# Patient Record
Sex: Male | Born: 1955 | Race: Black or African American | Hispanic: No | Marital: Single | Smoking: Former smoker
Health system: Southern US, Community
[De-identification: ages and names within clinical notes are randomized; demographics above are authoritative.]

## PROBLEM LIST (undated history)

## (undated) DIAGNOSIS — I10 Essential (primary) hypertension: Secondary | ICD-10-CM

## (undated) DIAGNOSIS — I219 Acute myocardial infarction, unspecified: Secondary | ICD-10-CM

## (undated) DIAGNOSIS — I2510 Atherosclerotic heart disease of native coronary artery without angina pectoris: Secondary | ICD-10-CM

## (undated) DIAGNOSIS — E785 Hyperlipidemia, unspecified: Secondary | ICD-10-CM

## (undated) DIAGNOSIS — I509 Heart failure, unspecified: Secondary | ICD-10-CM

## (undated) DIAGNOSIS — R55 Syncope and collapse: Secondary | ICD-10-CM

## (undated) HISTORY — DX: Hyperlipidemia, unspecified: E78.5

## (undated) HISTORY — DX: Syncope and collapse: R55

## (undated) HISTORY — PX: HERNIA REPAIR: SHX51

## (undated) HISTORY — DX: Essential (primary) hypertension: I10

## (undated) HISTORY — DX: Heart failure, unspecified: I50.9

## (undated) HISTORY — DX: Acute myocardial infarction, unspecified: I21.9

## (undated) HISTORY — DX: Atherosclerotic heart disease of native coronary artery without angina pectoris: I25.10

---

## 2004-09-18 ENCOUNTER — Ambulatory Visit: Payer: Self-pay | Admitting: Surgery

## 2013-01-10 ENCOUNTER — Emergency Department (HOSPITAL_COMMUNITY)
Admission: EM | Admit: 2013-01-10 | Discharge: 2013-01-10 | Disposition: A | Discharge status: Home / Self Care | Payer: No Typology Code available for payment source | Attending: Emergency Medicine | Admitting: Emergency Medicine

## 2013-01-10 ENCOUNTER — Encounter (HOSPITAL_COMMUNITY): Payer: Self-pay | Admitting: Emergency Medicine

## 2013-01-10 ENCOUNTER — Emergency Department (HOSPITAL_COMMUNITY): Payer: No Typology Code available for payment source

## 2013-01-10 DIAGNOSIS — F17200 Nicotine dependence, unspecified, uncomplicated: Secondary | ICD-10-CM | POA: Insufficient documentation

## 2013-01-10 DIAGNOSIS — Y92410 Unspecified street and highway as the place of occurrence of the external cause: Secondary | ICD-10-CM | POA: Insufficient documentation

## 2013-01-10 DIAGNOSIS — Y9389 Activity, other specified: Secondary | ICD-10-CM | POA: Insufficient documentation

## 2013-01-10 DIAGNOSIS — S39012A Strain of muscle, fascia and tendon of lower back, initial encounter: Secondary | ICD-10-CM

## 2013-01-10 DIAGNOSIS — S335XXA Sprain of ligaments of lumbar spine, initial encounter: Secondary | ICD-10-CM | POA: Insufficient documentation

## 2013-01-10 MED ORDER — CYCLOBENZAPRINE HCL 5 MG PO TABS: 5.0000 mg | ORAL_TABLET | Freq: Three times a day (TID) | ORAL | Status: DC | PRN

## 2013-01-10 MED ORDER — HYDROCODONE-ACETAMINOPHEN 5-325 MG PO TABS: 1.0000 | ORAL_TABLET | ORAL | Status: DC | PRN
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
## 2013-01-10 NOTE — ED Provider Notes (Signed)
Medical screening examination/treatment/procedure(s) were performed by non-physician practitioner and as supervising physician I was immediately available for consultation/collaboration.  EKG Interpretation   None      Medical screening examination/treatment/procedure(s) were performed by non-physician practitioner and as supervising physician I was immediately available for consultation/collaboration.  EKG Interpretation   None          Benny Lennert, MD 01/10/13 1444

## 2013-01-10 NOTE — ED Notes (Addendum)
4/5 muscle strength L hip flexor against gravity and resistance. Pain elicited in raising L leg to gravity.  Plantar flexion 5/5 bilaterally. Denies loss of sensation or dragging L leg w/ambulation. No step off noted on spine palpation.

## 2013-01-10 NOTE — ED Notes (Signed)
Patient with no complaints at this time. Respirations even and unlabored. Skin warm/dry. Discharge instructions reviewed with patient at this time. Patient given opportunity to voice concerns/ask questions. Patient discharged at this time and left Emergency Department with steady gait.   

## 2013-05-17 HISTORY — PX: CARDIAC CATHETERIZATION: SHX172

## 2013-05-31 ENCOUNTER — Inpatient Hospital Stay: Payer: Self-pay | Admitting: Specialist

## 2013-05-31 DIAGNOSIS — I059 Rheumatic mitral valve disease, unspecified: Secondary | ICD-10-CM

## 2013-05-31 LAB — CBC
HCT: 44.6 % (ref 40.0–52.0)
HGB: 14.5 g/dL (ref 13.0–18.0)
MCH: 28.2 pg (ref 26.0–34.0)
MCHC: 32.5 g/dL (ref 32.0–36.0)
MCV: 87 fL (ref 80–100)
Platelet: 131 10*3/uL — ABNORMAL LOW (ref 150–440)
RBC: 5.15 10*6/uL (ref 4.40–5.90)
RDW: 14.2 % (ref 11.5–14.5)
WBC: 6.9 10*3/uL (ref 3.8–10.6)

## 2013-05-31 LAB — TROPONIN I
TROPONIN-I: 0.27 ng/mL — AB
TROPONIN-I: 12 ng/mL — AB
Troponin-I: 3.66 ng/mL — ABNORMAL HIGH

## 2013-05-31 LAB — COMPREHENSIVE METABOLIC PANEL
Albumin: 3.5 g/dL (ref 3.4–5.0)
Alkaline Phosphatase: 74 U/L
Anion Gap: 5 — ABNORMAL LOW (ref 7–16)
BUN: 10 mg/dL (ref 7–18)
Bilirubin,Total: 0.4 mg/dL (ref 0.2–1.0)
Calcium, Total: 8.6 mg/dL (ref 8.5–10.1)
Chloride: 104 mmol/L (ref 98–107)
Co2: 31 mmol/L (ref 21–32)
Creatinine: 1.4 mg/dL — ABNORMAL HIGH (ref 0.60–1.30)
EGFR (African American): >60
EGFR (Non-African Amer.): 55 — ABNORMAL LOW
Glucose: 129 mg/dL — ABNORMAL HIGH (ref 65–99)
Osmolality: 280 (ref 275–301)
POTASSIUM: 3.9 mmol/L (ref 3.5–5.1)
SGOT(AST): 19 U/L (ref 15–37)
SGPT (ALT): 15 U/L (ref 12–78)
Sodium: 140 mmol/L (ref 136–145)
TOTAL PROTEIN: 7.3 g/dL (ref 6.4–8.2)

## 2013-05-31 LAB — APTT
Activated PTT: 27.1 secs (ref 23.6–35.9)
Activated PTT: 49.3 secs — ABNORMAL HIGH (ref 23.6–35.9)

## 2013-05-31 LAB — CK TOTAL AND CKMB (NOT AT ARMC)
CK, TOTAL: 253 U/L
CK, TOTAL: 374 U/L — AB
CK, Total: 532 U/L — ABNORMAL HIGH
CK-MB: 5.2 ng/mL — ABNORMAL HIGH (ref 0.5–3.6)
CK-MB: 29.8 ng/mL — ABNORMAL HIGH (ref 0.5–3.6)
CK-MB: 48.4 ng/mL — ABNORMAL HIGH (ref 0.5–3.6)

## 2013-06-01 ENCOUNTER — Encounter: Payer: Self-pay | Admitting: Cardiovascular Disease

## 2013-06-01 DIAGNOSIS — I2510 Atherosclerotic heart disease of native coronary artery without angina pectoris: Secondary | ICD-10-CM

## 2013-06-01 DIAGNOSIS — I214 Non-ST elevation (NSTEMI) myocardial infarction: Secondary | ICD-10-CM

## 2013-06-01 DIAGNOSIS — I369 Nonrheumatic tricuspid valve disorder, unspecified: Secondary | ICD-10-CM

## 2013-06-01 LAB — HEMOGLOBIN A1C: Hemoglobin A1C: 5.3 % (ref 4.2–6.3)

## 2013-06-01 LAB — LIPID PANEL
Cholesterol: 148 mg/dL (ref 0–200)
HDL Cholesterol: 37 mg/dL — ABNORMAL LOW (ref 40–60)
Ldl Cholesterol, Calc: 96 mg/dL (ref 0–100)
Triglycerides: 76 mg/dL (ref 0–200)
VLDL Cholesterol, Calc: 15 mg/dL (ref 5–40)

## 2013-06-01 LAB — TROPONIN I
Troponin-I: 24 ng/mL — ABNORMAL HIGH
Troponin-I: 32 ng/mL — ABNORMAL HIGH
Troponin-I: 34 ng/mL — ABNORMAL HIGH

## 2013-06-01 LAB — CBC WITH DIFFERENTIAL/PLATELET
Basophil #: 0.1 x10 3/mm 3
Basophil %: 1.5 %
Eosinophil #: 0.1 x10 3/mm 3
Eosinophil %: 1 %
HCT: 42.8 %
HGB: 13.7 g/dL
Lymphocyte %: 25.1 %
Lymphs Abs: 2.1 x10 3/mm 3
MCH: 27.6 pg
MCHC: 32.1 g/dL
MCV: 86 fL
Monocyte #: 0.6 "x10 3/mm "
Monocyte %: 6.6 %
Neutrophil #: 5.5 x10 3/mm 3
Neutrophil %: 65.8 %
Platelet: 122 x10 3/mm 3 — ABNORMAL LOW
RBC: 4.98 x10 6/mm 3
RDW: 13.9 %
WBC: 8.4 x10 3/mm 3

## 2013-06-01 LAB — TSH: Thyroid Stimulating Horm: 0.541 u[IU]/mL

## 2013-06-01 LAB — CK TOTAL AND CKMB (NOT AT ARMC)
CK, TOTAL: 541 U/L — AB
CK, Total: 563 U/L — ABNORMAL HIGH
CK, Total: 594 U/L — ABNORMAL HIGH
CK-MB: 37.2 ng/mL — ABNORMAL HIGH (ref 0.5–3.6)
CK-MB: 48.7 ng/mL — ABNORMAL HIGH
CK-MB: 50.3 ng/mL — AB (ref 0.5–3.6)

## 2013-06-01 LAB — BASIC METABOLIC PANEL WITH GFR
Anion Gap: 2 — ABNORMAL LOW
BUN: 9 mg/dL
Calcium, Total: 8.4 mg/dL — ABNORMAL LOW
Chloride: 106 mmol/L
Co2: 29 mmol/L
Creatinine: 1.27 mg/dL
EGFR (African American): >60
EGFR (Non-African Amer.): >60
Glucose: 96 mg/dL
Osmolality: 272
Potassium: 4 mmol/L
Sodium: 137 mmol/L

## 2013-06-01 LAB — MAGNESIUM: Magnesium: 1.7 mg/dL — ABNORMAL LOW

## 2013-06-01 LAB — APTT
ACTIVATED PTT: 50.8 s — AB (ref 23.6–35.9)
Activated PTT: 51.5 s — ABNORMAL HIGH

## 2013-06-02 ENCOUNTER — Telehealth: Payer: Self-pay

## 2013-06-02 NOTE — Telephone Encounter (Signed)
Patient contacted regarding discharge from [facility] on 06/02/13.  Patient understands to follow up with Dr. Kirke Corin on 06/05/13 at 10:45 at [facility]. Patient understands discharge instructions? yes Patient understands medications and regimen? yes Patient understands to bring all medications to this visit? yes

## 2013-06-04 ENCOUNTER — Encounter: Payer: Self-pay | Admitting: *Deleted

## 2013-06-05 ENCOUNTER — Encounter: Payer: Self-pay | Admitting: *Deleted

## 2013-06-05 ENCOUNTER — Encounter (INDEPENDENT_AMBULATORY_CARE_PROVIDER_SITE_OTHER): Payer: Self-pay

## 2013-06-05 ENCOUNTER — Encounter: Payer: Self-pay | Admitting: Cardiovascular Disease

## 2013-06-05 ENCOUNTER — Ambulatory Visit (INDEPENDENT_AMBULATORY_CARE_PROVIDER_SITE_OTHER): Payer: BC Managed Care – PPO | Admitting: Cardiovascular Disease

## 2013-06-05 VITALS — BP 100/82 | HR 63 | Ht 70.5 in | Wt 160.2 lb

## 2013-06-05 DIAGNOSIS — I2510 Atherosclerotic heart disease of native coronary artery without angina pectoris: Secondary | ICD-10-CM

## 2013-06-05 DIAGNOSIS — Z79899 Other long term (current) drug therapy: Secondary | ICD-10-CM

## 2013-06-05 DIAGNOSIS — I2589 Other forms of chronic ischemic heart disease: Secondary | ICD-10-CM

## 2013-06-05 DIAGNOSIS — E785 Hyperlipidemia, unspecified: Secondary | ICD-10-CM

## 2013-06-05 DIAGNOSIS — I255 Ischemic cardiomyopathy: Secondary | ICD-10-CM | POA: Insufficient documentation

## 2013-06-05 DIAGNOSIS — R079 Chest pain, unspecified: Secondary | ICD-10-CM

## 2013-06-05 MED ORDER — ATORVASTATIN CALCIUM 80 MG PO TABS: 40.0000 mg | ORAL_TABLET | Freq: Every day | ORAL | Status: DC

## 2013-06-05 NOTE — Progress Notes (Signed)
HPI  This is a pleasant 58 year old [place] man who is here today for followup visit after recent hospitalization for non-ST elevation myocardial infarction. [facility] with [facility] and was found to have peak troponin of 32. He underwent cardiac catheterization which showed an occluded third diagonal which was small in diameter with left to left collaterals., Moderate 50% stenosis in mid left circumflex and ejection fraction of 40%. He was treated medically with no revascularization. He has been doing very well and denies any chest pain or dyspnea. His only complaint is diarrhea. No abdominal pain or myalgia. He works in [employer] and quit smoking after his MI.   No Known Allergies   Current Outpatient Prescriptions on File Prior to Visit  Medication Sig Dispense Refill  . aspirin 81 MG tablet Take 81 mg by mouth daily.      . clopidogrel (PLAVIX) 75 MG tablet Take 75 mg by mouth daily with breakfast.      . cyclobenzaprine (FLEXERIL) 5 MG tablet Take 1 tablet (5 mg total) by mouth 3 (three) times daily as needed for muscle spasms.  15 tablet  0  . HYDROcodone-acetaminophen (NORCO/VICODIN) 5-325 MG per tablet Take 1 tablet by mouth every 4 (four) hours as needed for pain.  15 tablet  0  . lisinopril (PRINIVIL,ZESTRIL) 5 MG tablet Take 5 mg by mouth daily.      . metoprolol tartrate (LOPRESSOR) 25 MG tablet Take 25 mg by mouth every 12 (twelve) hours.       No current facility-administered medications on file prior to visit.     Past Medical History  Diagnosis Date  . MI (myocardial infarction)   . Hypertension   . Syncope and collapse   . Coronary artery disease     Non-ST elevation myocardial infarction in March of 2015. Cardiac catheterization showed an occluded third diagonal (small-diameter with left to left collaterals), 50% mid LCX and EF of 40%.   . Hyperlipidemia      Past Surgical History  Procedure Laterality Date  . Hernia repair    .  [facility]  [facility]     Family History  Problem Relation Age of Onset  . Heart attack Brother [id number]     History   Social History  . Marital Status: Single    Spouse Name: N/A    Number of Children: N/A  . Years of Education: N/A   Occupational History  . Not on file.   Social History Main Topics  . Smoking status: Former Smoker -- 1.00 packs/day for 20 years    Types: Cigarettes  . Smokeless tobacco: Not on file  . Alcohol Use: Yes     Comment: socially  . Drug Use: No     Comment: past  . Sexual Activity: Not on file   Other Topics Concern  . Not on file   Social History Narrative  . No narrative on file     ROS A 10 point review of system was performed. It is negative other than that mentioned in the history of present illness.   PHYSICAL EXAM   BP 100/82  Pulse 63  Ht 5' 10.5" (1.791 m)  Wt 160 lb 4 oz (72.689 kg)  BMI 22.66 kg/m2 Constitutional: He is oriented to person, place, and time. He appears well-developed and well-nourished. No distress.  HENT: No nasal discharge.  Head: Normocephalic and atraumatic.  Eyes: Pupils are equal and round.  No  discharge. Neck: Normal range of motion. Neck supple. No JVD present. No thyromegaly present.  Cardiovascular: Normal rate, regular rhythm, normal heart sounds. Exam reveals no gallop and no friction rub. No murmur heard.  Pulmonary/Chest: Effort normal and breath sounds normal. No stridor. No respiratory distress. He has no wheezes. He has no rales. He exhibits no tenderness.  Abdominal: Soft. Bowel sounds are normal. He exhibits no distension. There is no tenderness. There is no rebound and no guarding.  Musculoskeletal: Normal range of motion. He exhibits no edema and no tenderness.  Neurological: He is alert and oriented to person, place, and time. Coordination normal.  Skin: Skin is warm and dry. No rash noted. He is not diaphoretic. No erythema. No pallor.  Psychiatric: He has a normal  mood and affect. His behavior is normal. Judgment and thought content normal.  Right radial pulse is normal with no hematoma     ENI:DPOEUM  Rhythm  P:QRS - 1:1, Abnormal P axis, H Rate 63  -  Negative T-waves  -Possible  Anterolateral  ischemia.   ABNORMAL    ASSESSMENT AND PLAN

## 2013-06-05 NOTE — Patient Instructions (Addendum)
Your physician has recommended you make the following change in your medication:  Decrease Atorvastatin to 40 mg daily (half a tablet daily)    Your physician recommends that you return for lab work in:  Fasting labs in 1 month   Your physician recommends that you schedule a follow-up appointment in:  3 months   Resume work June 17, 2013 with no restrictions.

## 2013-06-05 NOTE — Assessment & Plan Note (Signed)
He is having diarrhea which could be due to high dose atorvastatin. I decreased the dose to 40 mg once daily. Check fasting lipid and liver profile in 4 weeks.

## 2013-06-05 NOTE — Assessment & Plan Note (Signed)
He has no symptoms of congestive heart failure. Ejection fraction was 40%. Continue treatment with small dose metoprolol and lisinopril.   

## 2013-06-05 NOTE — Assessment & Plan Note (Signed)
He is doing very well with no symptoms suggestive of recurrent angina. Continue medical therapy. He can resume work on April 1 with no restrictions. He is not able to attend cardiac rehabilitation.

## 2013-06-10 ENCOUNTER — Telehealth: Payer: Self-pay

## 2013-06-10 NOTE — Telephone Encounter (Signed)
Pt would like a nurse to call to discuss his medications.

## 2013-06-10 NOTE — Telephone Encounter (Signed)
Reviewed medication change from previous visit. Decrease Atorvastatin to 40 mg daily. Patient verbalized understanding.

## 2013-06-12 ENCOUNTER — Other Ambulatory Visit: Payer: BC Managed Care – PPO

## 2013-07-06 ENCOUNTER — Ambulatory Visit (INDEPENDENT_AMBULATORY_CARE_PROVIDER_SITE_OTHER): Payer: BC Managed Care – PPO | Admitting: *Deleted

## 2013-07-06 DIAGNOSIS — Z79899 Other long term (current) drug therapy: Secondary | ICD-10-CM

## 2013-07-07 LAB — HEPATIC FUNCTION PANEL
ALT: 22 IU/L (ref 0–44)
AST: 25 IU/L (ref 0–40)
Albumin: 4.2 g/dL (ref 3.5–5.5)
Alkaline Phosphatase: 78 IU/L (ref 39–117)
BILIRUBIN DIRECT: 0.15 mg/dL (ref 0.00–0.40)
Total Bilirubin: 0.7 mg/dL (ref 0.0–1.2)
Total Protein: 7 g/dL (ref 6.0–8.5)

## 2013-07-07 LAB — LIPID PANEL
Chol/HDL Ratio: 3.8 ratio units (ref 0.0–5.0)
Cholesterol, Total: 143 mg/dL (ref 100–199)
HDL: 38 mg/dL — ABNORMAL LOW (ref >39)
LDL Calculated: 85 mg/dL (ref 0–99)
TRIGLYCERIDES: 99 mg/dL (ref 0–149)
VLDL Cholesterol Cal: 20 mg/dL (ref 5–40)

## 2013-07-31 ENCOUNTER — Telehealth: Payer: Self-pay

## 2013-07-31 ENCOUNTER — Other Ambulatory Visit: Payer: Self-pay | Admitting: *Deleted

## 2013-07-31 MED ORDER — LISINOPRIL 5 MG PO TABS: 5.0000 mg | ORAL_TABLET | Freq: Every day | ORAL | Status: DC

## 2013-07-31 MED ORDER — CLOPIDOGREL BISULFATE 75 MG PO TABS: 75.0000 mg | ORAL_TABLET | Freq: Every day | ORAL | Status: DC

## 2013-07-31 MED ORDER — ATORVASTATIN CALCIUM 80 MG PO TABS: 40.0000 mg | ORAL_TABLET | Freq: Every day | ORAL | Status: DC

## 2013-07-31 MED ORDER — METOPROLOL TARTRATE 25 MG PO TABS: 25.0000 mg | ORAL_TABLET | Freq: Two times a day (BID) | ORAL | Status: DC

## 2013-07-31 NOTE — Telephone Encounter (Signed)
Pt called and has a question regarding his medications.

## 2013-07-31 NOTE — Telephone Encounter (Signed)
Pt called needing refill on cardiac meds  Refill will be sent

## 2013-09-10 ENCOUNTER — Ambulatory Visit (INDEPENDENT_AMBULATORY_CARE_PROVIDER_SITE_OTHER): Payer: BC Managed Care – PPO | Admitting: Cardiovascular Disease

## 2013-09-10 ENCOUNTER — Encounter: Payer: Self-pay | Admitting: Cardiovascular Disease

## 2013-09-10 VITALS — BP 114/90 | HR 60 | Ht 70.0 in | Wt 170.0 lb

## 2013-09-10 DIAGNOSIS — I2589 Other forms of chronic ischemic heart disease: Secondary | ICD-10-CM

## 2013-09-10 DIAGNOSIS — E785 Hyperlipidemia, unspecified: Secondary | ICD-10-CM

## 2013-09-10 DIAGNOSIS — I255 Ischemic cardiomyopathy: Secondary | ICD-10-CM

## 2013-09-10 DIAGNOSIS — I2510 Atherosclerotic heart disease of native coronary artery without angina pectoris: Secondary | ICD-10-CM

## 2013-09-10 DIAGNOSIS — I25119 Atherosclerotic heart disease of native coronary artery with unspecified angina pectoris: Secondary | ICD-10-CM

## 2013-09-10 DIAGNOSIS — I209 Angina pectoris, unspecified: Secondary | ICD-10-CM

## 2013-09-10 NOTE — Assessment & Plan Note (Signed)
He is doing very well with no symptoms suggestive of angina. Continue medical therapy. Plavix can be held 5 days before dental procedures if needed given that he did not have any angioplasty or stent placement.  The plan is to treat with dual antiplatelet therapy for one year.

## 2013-09-10 NOTE — Assessment & Plan Note (Signed)
He has no symptoms of congestive heart failure. Ejection fraction was 40%. Continue treatment with small dose metoprolol and lisinopril.

## 2013-09-10 NOTE — Progress Notes (Signed)
HPI  This is a pleasant 58 year old [place] man who is here today for followup visit regarding coronary artery disease.  [facility] with non-ST elevation myocardial infarction and peak troponin of 32. He underwent cardiac catheterization which showed an occluded third diagonal which was small in diameter with left to left collaterals., Moderate 50% stenosis in mid left circumflex and ejection fraction of 40%. He was treated medically with no revascularization. He has been doing very well and denies any chest pain or dyspnea. His only complaint is diarrhea. No abdominal pain or myalgia. He works in [employer] and quit smoking after his MI. He resumed work without limitations.   No Known Allergies   Current Outpatient Prescriptions on File Prior to Visit  Medication Sig Dispense Refill  . aspirin 81 MG tablet Take 81 mg by mouth daily.      Marland Kitchen. atorvastatin (LIPITOR) 80 MG tablet Take 0.5 tablets (40 mg total) by mouth daily.  15 tablet  6  . clopidogrel (PLAVIX) 75 MG tablet Take 1 tablet (75 mg total) by mouth daily with breakfast.  30 tablet  6  . lisinopril (PRINIVIL,ZESTRIL) 5 MG tablet Take 1 tablet (5 mg total) by mouth daily.  30 tablet  6  . metoprolol tartrate (LOPRESSOR) 25 MG tablet Take 1 tablet (25 mg total) by mouth every 12 (twelve) hours.  60 tablet  6   No current facility-administered medications on file prior to visit.     Past Medical History  Diagnosis Date  . MI (myocardial infarction)   . Hypertension   . Syncope and collapse   . Coronary artery disease     Non-ST elevation myocardial infarction in March of 2015. Cardiac catheterization showed an occluded third diagonal (small-diameter with left to left collaterals), 50% mid LCX and EF of 40%.   . Hyperlipidemia      Past Surgical History  Procedure Laterality Date  . Hernia repair    . Card[facility]  [facility]     Family History  Problem Relation  Age of Onset  . Heart attack Brother [id number]     History   Social History  . Marital Status: Single    Spouse Name: N/A    Number of Children: N/A  . Years of Education: N/A   Occupational History  . Not on file.   Social History Main Topics  . Smoking status: Former Smoker -- 1.00 packs/day for 20 years    Types: Cigarettes  . Smokeless tobacco: Not on file  . Alcohol Use: Yes     Comment: socially  . Drug Use: No     Comment: past  . Sexual Activity: Not on file   Other Topics Concern  . Not on file   Social History Narrative  . No narrative on file     ROS A 10 point review of system was performed. It is negative other than that mentioned in the history of present illness.   PHYSICAL EXAM   BP 114/90  Pulse 60  Ht 5\' 10"  (1.778 m)  Wt 170 lb (77.111 kg)  BMI 24.39 kg/m2 Constitutional: He is oriented to person, place, and time. He appears well-developed and well-nourished. No distress.  HENT: No nasal discharge.  Head: Normocephalic and atraumatic.  Eyes: Pupils are equal and round.  No discharge. Neck: Normal range of motion. Neck supple. No JVD present. No thyromegaly present.  Cardiovascular: Normal rate, regular rhythm,  normal heart sounds. Exam reveals no gallop and no friction rub. No murmur heard.  Pulmonary/Chest: Effort normal and breath sounds normal. No stridor. No respiratory distress. He has no wheezes. He has no rales. He exhibits no tenderness.  Abdominal: Soft. Bowel sounds are normal. He exhibits no distension. There is no tenderness. There is no rebound and no guarding.  Musculoskeletal: Normal range of motion. He exhibits no edema and no tenderness.  Neurological: He is alert and oriented to person, place, and time. Coordination normal.  Skin: Skin is warm and dry. No rash noted. He is not diaphoretic. No erythema. No pallor.  Psychiatric: He has a normal mood and affect. His behavior is normal. Judgment and thought content normal.        ASSESSMENT AND PLAN

## 2013-09-10 NOTE — Assessment & Plan Note (Signed)
Lab Results  Component Value Date   HDL 38* 07/06/2013   LDLCALC 85 07/06/2013   TRIG 99 07/06/2013   CHOLHDL 3.8 07/06/2013   Continue treatment with atorvastatin.

## 2013-09-10 NOTE — Patient Instructions (Signed)
Continue same medications.   You can stop Plavix 5 days before dental work and then resume after.   Your physician wants you to follow-up in: 6 months.  You will receive a reminder letter in the mail two months in advance. If you don't receive a letter, please call our office to schedule the follow-up appointment.

## 2013-10-05 ENCOUNTER — Telehealth: Payer: Self-pay | Admitting: *Deleted

## 2013-10-05 NOTE — Telephone Encounter (Signed)
The pharmacy he used is very expensive. He can get generic plavix for very cheap from [facility] in [place]. He should call them and ask.

## 2013-10-05 NOTE — Telephone Encounter (Signed)
Patient called and medicine is too expensive (Clopidogrel). Paid $89.78 for 30 day supply.

## 2013-10-06 NOTE — Telephone Encounter (Signed)
Informed patient of Dr. Sheilah Pigeon response  Patient stated he will call pharmacy and then call me to let me know if he would like me to send rx to [facility]

## 2014-03-15 ENCOUNTER — Ambulatory Visit: Payer: BC Managed Care – PPO | Admitting: Cardiovascular Disease

## 2014-03-15 ENCOUNTER — Encounter: Payer: Self-pay | Admitting: *Deleted

## 2014-05-19 ENCOUNTER — Other Ambulatory Visit: Payer: Self-pay | Admitting: *Deleted

## 2014-05-19 ENCOUNTER — Telehealth: Payer: Self-pay | Admitting: Cardiovascular Disease

## 2014-05-19 MED ORDER — METOPROLOL TARTRATE 25 MG PO TABS: 25.0000 mg | ORAL_TABLET | Freq: Two times a day (BID) | ORAL | Status: DC

## 2014-05-19 MED ORDER — CLOPIDOGREL BISULFATE 75 MG PO TABS: 75.0000 mg | ORAL_TABLET | Freq: Every day | ORAL | Status: DC

## 2014-05-19 MED ORDER — LISINOPRIL 5 MG PO TABS: 5.0000 mg | ORAL_TABLET | Freq: Every day | ORAL | Status: DC

## 2014-05-19 MED ORDER — ASPIRIN 81 MG PO TABS: 81.0000 mg | ORAL_TABLET | Freq: Every day | ORAL | Status: AC

## 2014-05-19 MED ORDER — ATORVASTATIN CALCIUM 80 MG PO TABS: 40.0000 mg | ORAL_TABLET | Freq: Every day | ORAL | Status: DC

## 2014-05-19 NOTE — Telephone Encounter (Signed)
Rx sent for all cardiac medications until pt is able to come in for appointment.

## 2014-05-19 NOTE — Telephone Encounter (Signed)
°  1. Which medications need to be refilled? All of them (see list)   Patient only has enough for the next 2 days   Aspirin (Tab) aspirin 81 MG     Atorvastatin Calcium (Tab) LIPITOR 80 MG   Clopidogrel Bisulfate (Tab) PLAVIX 75 MG     Lisinopril (Tab) PRINIVIL,ZESTRIL 5 MG     Metoprolol Tartrate (Tab) LOPRESSOR 25 MG       2. Which pharmacy is medication to be sent to?[facility] [place]   3. Do they need a 30 day or 90 day supply? Did not have preference  4. Would they like a call back once the medication has been sent to the pharmacy? Yes please, Patient is Overdue for 6 month but would like to check to see if [facility] will refill long enough for him to make it to his upcoming appt.

## 2014-05-31 ENCOUNTER — Ambulatory Visit: Payer: Self-pay | Admitting: Cardiovascular Disease

## 2014-06-17 ENCOUNTER — Encounter (INDEPENDENT_AMBULATORY_CARE_PROVIDER_SITE_OTHER): Payer: Self-pay

## 2014-06-17 ENCOUNTER — Encounter: Payer: Self-pay | Admitting: Cardiovascular Disease

## 2014-06-17 ENCOUNTER — Ambulatory Visit (INDEPENDENT_AMBULATORY_CARE_PROVIDER_SITE_OTHER): Payer: BLUE CROSS/BLUE SHIELD | Admitting: Cardiovascular Disease

## 2014-06-17 VITALS — BP 128/100 | HR 65 | Ht 71.0 in | Wt 178.0 lb

## 2014-06-17 DIAGNOSIS — I255 Ischemic cardiomyopathy: Secondary | ICD-10-CM

## 2014-06-17 DIAGNOSIS — E785 Hyperlipidemia, unspecified: Secondary | ICD-10-CM

## 2014-06-17 DIAGNOSIS — I25119 Atherosclerotic heart disease of native coronary artery with unspecified angina pectoris: Secondary | ICD-10-CM | POA: Diagnosis not present

## 2014-06-17 MED ORDER — METOPROLOL TARTRATE 25 MG PO TABS: 25.0000 mg | ORAL_TABLET | Freq: Two times a day (BID) | ORAL | Status: DC

## 2014-06-17 MED ORDER — ATORVASTATIN CALCIUM 80 MG PO TABS: 40.0000 mg | ORAL_TABLET | Freq: Every day | ORAL | Status: DC

## 2014-06-17 MED ORDER — LISINOPRIL 5 MG PO TABS: 5.0000 mg | ORAL_TABLET | Freq: Every day | ORAL | Status: DC

## 2014-06-17 NOTE — Assessment & Plan Note (Signed)
Lab Results  Component Value Date   CHOL 143 07/06/2013   HDL 38* 07/06/2013   LDLCALC 85 07/06/2013   TRIG 99 07/06/2013   CHOLHDL 3.8 07/06/2013   Continue treatment with atorvastatin.

## 2014-06-17 NOTE — Assessment & Plan Note (Signed)
He has no symptoms of congestive heart failure. Ejection fraction was 40%. I resumed treatment with small dose metoprolol and lisinopril.

## 2014-06-17 NOTE — Progress Notes (Signed)
HPI  This is a pleasant 59 year old [place] man who is here today for followup visit regarding coronary artery disease.  He was hospitalized at [facility] in March of 2015 with non-ST elevation myocardial infarction and peak troponin of 32. He underwent cardiac catheterization which showed an occluded third diagonal which was small in diameter with left to left collaterals., Moderate 50% stenosis in mid left circumflex and ejection fraction of 40%. He was treated medically with no revascularization. He has been doing very well and denies any chest pain or dyspnea. 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 He stopped taking his medications in December due to financial reasons.   No Known Allergies   Current Outpatient Prescriptions on File Prior to Visit  Medication Sig Dispense Refill  . aspirin 81 MG tablet Take 1 tablet (81 mg total) by mouth daily. 30 tablet 1  . atorvastatin (LIPITOR) 80 MG tablet Take 0.5 tablets (40 mg total) by mouth daily. (Patient not taking: Reported on 06/17/2014) 15 tablet 1  . clopidogrel (PLAVIX) 75 MG tablet Take 1 tablet (75 mg total) by mouth daily with breakfast. (Patient not taking: Reported on 06/17/2014) 30 tablet 1  . lisinopril (PRINIVIL,ZESTRIL) 5 MG tablet Take 1 tablet (5 mg total) by mouth daily. (Patient not taking: Reported on 06/17/2014) 30 tablet 1  . metoprolol tartrate (LOPRESSOR) 25 MG tablet Take 1 tablet (25 mg total) by mouth every 12 (twelve) hours. (Patient not taking: Reported on 06/17/2014) 60 tablet 1   No current facility-administered medications on file prior to visit.     Past Medical History  Diagnosis Date  . MI (myocardial infarction)   . Hypertension   . Syncope and collapse   . Coronary artery disease     Non-ST elevation myocardial infarction in March of 2015. Cardiac catheterization showed an occluded third diagonal (small-diameter with left to left collaterals), 50% mid LCX and EF of 40%.   . Hyperlipidemia       Past Surgical History  Procedure Laterality Date  . Hernia repair    . Cardiac catheterization  05/2013    [facility]     Family History  Problem Relation Age of Onset  . Heart attack Brother 82     History   Social History  . Marital Status: Single    Spouse Name: N/A  . Number of Children: N/A  . Years of Education: N/A   Occupational History  . Not on file.   Social History Main Topics  . Smoking status: Former Smoker -- 1.00 packs/day for 20 years    Types: Cigarettes  . Smokeless tobacco: Not on file  . Alcohol Use: Yes     Comment: socially  . Drug Use: No     Comment: past  . Sexual Activity: Not on file   Other Topics Concern  . Not on file   Social History Narrative     ROS A 10 point review of system was performed. It is negative other than that mentioned in the history of present illness.   PHYSICAL EXAM   BP 128/100 mmHg  Pulse 65  Ht 5\' 11"  (1.803 m)  Wt 178 lb (80.74 kg)  BMI 24.84 kg/m2 Constitutional: He is oriented to person, place, and time. He appears well-developed and well-nourished. No distress.  HENT: No nasal discharge.  Head: Normocephalic and atraumatic.  Eyes: Pupils are equal and round.  No discharge. Neck: Normal range of motion. Neck supple. No JVD present. No  thyromegaly present.  Cardiovascular: Normal rate, regular rhythm, normal heart sounds. Exam reveals no gallop and no friction rub. No murmur heard.  Pulmonary/Chest: Effort normal and breath sounds normal. No stridor. No respiratory distress. He has no wheezes. He has no rales. He exhibits no tenderness.  Abdominal: Soft. Bowel sounds are normal. He exhibits no distension. There is no tenderness. There is no rebound and no guarding.  Musculoskeletal: Normal range of motion. He exhibits no edema and no tenderness.  Neurological: He is alert and oriented to person, place, and time. Coordination normal.  Skin: Skin is warm and dry. No rash noted. He is not diaphoretic.  No erythema. No pallor.  Psychiatric: He has a normal mood and affect. His behavior is normal. Judgment and thought content normal.    ZOX:[id number]  Rhythm  WITHIN NORMAL LIMITS   ASSESSMENT AND PLAN

## 2014-06-17 NOTE — Patient Instructions (Signed)
Stop taking Clopidogrel ( Plavix ).  Continue other medications.   Your physician wants you to follow-up in: 6 months.  You will receive a reminder letter in the mail two months in advance. If you don't receive a letter, please call our office to schedule the follow-up appointment.  

## 2014-06-17 NOTE — Assessment & Plan Note (Signed)
He is doing very well overall from a cardiac standpoint with no symptoms suggestive of angina. I discussed with him the importance of taking his medications regularly. It has been more than one year since his myocardial infarction. Thus, I discontinued treatment with Plavix.

## 2014-07-10 NOTE — Consult Note (Signed)
Brief Consult Note: Diagnosis: NSTEMI.   Patient was seen by consultant.   Consult note dictated.   Recommend to proceed with surgery or procedure.   Comments: NSTEMI.  Cardiac cath today.  Electronic Signatures: Lorine Bears (MD)  (Signed 16-Mar-15 07:08)  Authored: Brief Consult Note   Last Updated: 16-Mar-15 07:08 by Lorine Bears (MD)

## 2014-07-10 NOTE — Discharge Summary (Signed)
PATIENT NAME:  Manuel Hess, Manuel Hess MR#:  [id number] DATE OF BIRTH:  06-May-1955  DATE OF ADMISSION:  05/31/2013 DATE OF DISCHARGE:  06/02/2013  For a detailed note, please take a look at the history and physical done on admission by Dr. Jacques Navy.   DIAGNOSES AT DISCHARGE: As follows: 1.  Non-ST-elevation myocardial infarction.  2.  Hypertension.  3.  Coronary artery disease.   DIET: The patient is being discharged on a low-sodium, low-fat diet.   ACTIVITY: As tolerated.   FOLLOWUP: With Dr. Lorine Bears in the next 1 to 2 weeks.   DISCHARGE MEDICATIONS: Aspirin 81 mg daily, Plavix 75 mg daily, lisinopril 5 mg daily, atorvastatin 80 mg at bedtime, and metoprolol tartrate 25 mg b.i.d.   CONSULTANTS DURING THE HOSPITAL COURSE: Dr. Kirke Corin from cardiology.   PERTINENT STUDIES DONE DURING THE HOSPITAL COURSE: Chest x-ray done on admission showing mild bibasilar atelectasis. Echocardiogram done on March 15 showing EF of 35% to 40%, moderately decreased global LV systolic function, diffuse hypokinesis, diastolic relaxation abnormality noted, mild mitral valve regurgitation, mild tricuspid regurgitation.   The patient had a cardiac catheterization on March 16, which showed global ejection fraction of 40%. The patient had severe 1-vessel coronary artery disease, occluded third diagonal.   HOSPITAL COURSE: This is a 59 year old male with medical problems as mentioned above, presented to the hospital with chest pain and noted to have elevated troponins.  1.  Non-ST elevation MI: This was the likely diagnosis given the patient's chest pain and elevated troponins. The patient's troponins peaked as high as 30s. The patient was given Lovenox, started on aspirin, nitro, beta blocker, and statin. A cardiology consult was obtained. The patient underwent a cardiac catheterization done by Dr. Kirke Corin, which showed a completely occluded diagonal vessel, which was likely culprit vessel for the MI, although was completely   occluded and therefore not amenable to intervention. At this point, we are managing the patient medically with aspirin, Plavix, beta blocker, statin, and ACE inhibitor, The patient will have short followup with cardiology as an outpatient. The patient is presently chest pain-free and hemodynamically stable and therefore being discharged home. He is advised to not lift anything heavy over 10 pounds in the next 2 weeks and is to stay off of work for the next 2 weeks.  2.  Hypertension: The patient remained hemodynamically stable. He will continue his metoprolol and ACE inhibitor as stated.  3.  Hyperlipidemia: Given his recent MI, the patient was started on high-dose statin and he will resume that.   CODE STATUS: The patient is a full code.   TIME SPENT: 40 minutes.   ____________________________ Rolly Pancake. Cherlynn Kaiser, MD vjs:jcm D: 06/02/2013 16:08:12 ET T: 06/02/2013 20:47:07 ET JOB#: [id number]  cc: Rolly Pancake. Cherlynn Kaiser, MD, <Dictator> Muhammad A. Kirke Corin, MD Houston Siren MD ELECTRONICALLY SIGNED 06/12/2013 83:29

## 2014-07-10 NOTE — Consult Note (Signed)
PATIENT NAME:  Manuel Hess, Manuel Hess MR#:  [id number] DATE OF BIRTH:  May 27, 1955  DATE OF CONSULTATION:  06/01/2013  REFERRING PHYSICIAN:  Krystal Eaton, MD CONSULTING PHYSICIAN:  Bynum Mccullars A. Kirke Corin, MD  REASON FOR CONSULTATION: Non-ST elevation myocardial infarction.   HISTORY OF PRESENT ILLNESS: This is a 59 year old African American male with no previous cardiac history. He is a long-time smoker but otherwise has no past medical history. He presented to the Emergency Room with substernal chest pain that started around 8:00 in the morning. It persisted until he was picked up by EMS and given 1 sublingual nitroglycerin. His chest pain resolved. He had mild recurrent chest pain last night but he is chest pain-free now. Initial troponin was 0.27 and subsequently increased to 32. He denies any previous cardiac history or any previous similar episodes. The chest pain has been associated with shortness of breath but no other symptoms.   PAST MEDICAL HISTORY: Chronic tobacco use.   HOME MEDICATIONS:  Include aspirin 81 mg once daily.   ALLERGIES: No known drug allergies.   SOCIAL HISTORY: Remarkable for smoking 1 pack per day for many years. He denies any alcohol or recreational drug use. He works in [employer] techn[employer].   FAMILY HISTORY: His brother had massive myocardial infarction at the age of 45. There is family history of cancer as well.   REVIEW OF SYSTEMS: A 10-point review of systems was performed. It is negative other than what is mentioned in the HPI.    PHYSICAL EXAMINATION: GENERAL: The patient appears to be at his stated age, in no acute distress.  VITAL SIGNS: Temperature 98.1, pulse 67, respiratory rate 20, blood pressure is 133/83 and oxygen saturation is 95% on room air.  HEENT: Normocephalic, atraumatic.  NECK: No JVD or carotid bruits.  RESPIRATORY: Normal respiratory effort with no use of accessory muscles. Auscultation reveals normal breath sounds.  CARDIOVASCULAR: Normal PMI. Normal S1  and S2 with no gallops or murmurs.  ABDOMEN: Benign, nontender and nondistended.  EXTREMITIES: No clubbing, cyanosis or edema.  SKIN: Warm and dry with no rash.  PSYCHIATRIC: He is alert, oriented x 3 with normal mood and affect.   LABORATORY, DIAGNOSTIC AND RADIOLOGICAL DATA: Creatinine is 1.27. Troponin most recently was 32 with a CK-MB of 50. CBC is unremarkable. ECG showed sinus rhythm with inferior T wave inversion but no significant ST deviation.   IMPRESSION: 1.  Non-ST elevation myocardial infarction.  2.  Tobacco use.   RECOMMENDATIONS: I recommend continuing treatment with aspirin, unfractionated heparin, beta blocker and a statin. He is currently chest pain-free. Echocardiogram showed ejection fraction of 35% to 40%. There was mild mitral and tricuspid regurgitation. I recommend proceeding with cardiac catheterization and possible coronary intervention. Risks, benefits and alternatives were discussed with the patient. Further recommendations to follow after cardiac catheterization.   ____________________________ Chelsea Aus Kirke Corin, MD maa:cs D: 06/01/2013 07:49:40 ET T: 06/01/2013 14:06:36 ET JOB#: [id number]  cc: Biana Haggar A. Kirke Corin, MD, <Dictator> Iran Ouch MD ELECTRONICALLY SIGNED 06/11/2013 8:05

## 2014-07-10 NOTE — H&P (Signed)
PATIENT NAME:  Manuel Hess, Manuel Hess MR#:  [id number] DATE OF BIRTH:  1955-10-18  DATE OF ADMISSION:  05/31/2013  PRIMARY CARE PHYSICIAN: None.   REFERRING PHYSICIAN: Su Ley, MD  CHIEF COMPLAINT: Chest pain.   HISTORY OF PRESENT ILLNESS: The patient is a pleasant 59 year old male with no significant past medical history, smoker, who comes in for acute-onset chest pain about 8:00 without significant radiation but with shortness of breath. The pain persisted, he called EMS and here the pain improved with 1 sublingual nitroglycerin. Initial troponin was negative but the second one went to 0.27. Chest pain is significantly better with the nitro and at this point, he has been started on a heparin drip and hospitalist services were contacted for further evaluation and management.   PAST MEDICAL HISTORY:  Denies.   ALLERGIES: Denies.   OUTPATIENT MEDICATIONS: Aspirin 81 mg daily.   SOCIAL HISTORY: Smokes a pack a day. No alcohol or drug use. Works in the [employer].   FAMILY HISTORY: Cancer runs in the family. Brother with massive MI at age 88.   REVIEW OF SYSTEMS:    CONSTITUTIONAL: No fever, fatigue, weakness or weight changes.  EYES: No blurry vision or double vision.  ENT: No tinnitus, hearing loss.  RESPIRATORY: No cough, wheezing, shortness of breath.  CARDIOVASCULAR: Chest pain as above. Chest pain significantly improved currently. No orthopnea, swelling in the legs or arrhythmia. No history of MI in the past.  GASTROINTESTINAL: No nausea, vomiting, diarrhea, black or tarry stools or bloody stools.  GENITOURINARY: Denies dysuria, hematuria.  HEMATOLOGIC AND LYMPHATIC: No anemia or easy bruising.  SKIN: No rashes.  MUSCULOSKELETAL: Denies arthritis or gout.  NEUROLOGIC: No focal weakness or numbness.  PSYCHIATRIC: Denies anxiety or insomnia.   PHYSICAL EXAMINATION: VITAL SIGNS: Temperature on arrival noted to be 97.5, pulse rate 57, respiratory rate 16, blood pressure  144/99, O2 sat 97% on room air.  GENERAL: The patient is a pleasant male lying in bed, no obvious distress.  HEENT: Normocephalic, atraumatic. Pupils are equal and reactive. Anicteric sclerae. Moist mucous membranes.  NECK: Supple. No thyroid tenderness. No cervical lymphadenopathy.  CARDIOVASCULAR: S1, S2 regular. No significant murmurs, rubs or gallops.  LUNGS: Clear to auscultation. No wheezing, rhonchi or rales.  ABDOMEN: Soft, nontender, nondistended. Positive bowel sounds in all quadrants.  EXTREMITIES: No pitting edema.  NEURO:  Cranial nerves II through XII grossly intact. Strength is 5 out of 5 in all extremities. Sensation is intact to light touch.  PSYCHIATRIC: Awake, alert, oriented x 3.  SKIN: No obvious rashes or lesions.   LABORATORY, DIAGNOSTIC AND RADIOLOGICAL DATA:  Troponin initially negative, next was 0.27 LFTs within normal limits. Creatinine mildly elevated at 1.4, sodium 140, potassium 3.9, BUN 10, glucose 129. White count of 6.9, hemoglobin is 14.5, platelets 131.  We do not have any previous labs past few years. EKG normal sinus rhythm, rate of 60, no acute ST elevations or depressions. X-ray chest, 1 view, showing mild right basilar atelectasis.   ASSESSMENT AND PLAN: We have a pleasant 59 year old smoker who comes in with acute-onset chest pain with positive troponin and no significant EKG changes consistent with non-ST elevation myocardial infarction.  At this point, we would continue the heparin drip, admit him to telemetry, cycle the troponins and start the patient on aspirin, a statin, low-dose beta blocker given the heart rate, obtain a cardiology consult, order an echocardiogram. The patient will likely need a cardiac catheterization. Would check a lipid profile as well  as a hemoglobin A1c and TSH. Would follow with the enzymes. At this point, the chest pain is significantly improved. He was counseled for more than 3 minutes about his tobacco use and he is motivated to  quit. Would follow him on telemetry and follow with PTT. He does have mild renal failure but do not know if this is acute or chronic with GFR placing him in stage 3 chronic kidney disease. Furthermore, he has mild thrombocytopenia of unknown etiology. Would follow the platelet count tomorrow  as well as start the patient on gentle IV fluids. Recheck with another GFR set tomorrow. Start him on proton pump inhibitor for gastrointestinal prophylaxis. The patient is a full code.   TOTAL TIME SPENT: Is 45 minutes.    ____________________________ Krystal Eaton, MD sa:cs D: 05/31/2013 14:28:53 ET T: 05/31/2013 15:56:58 ET JOB#: [id number]  cc: Krystal Eaton, MD, <Dictator> Krystal Eaton MD ELECTRONICALLY SIGNED 06/18/2013 15:47

## 2014-10-05 IMAGING — CR DG CHEST 1V PORT
1 series · 1 of 1 positions shown · non-contrast
Comparison: None.

CLINICAL DATA: Substernal chest pain

EXAM:
PORTABLE CHEST - 1 VIEW

[ap]
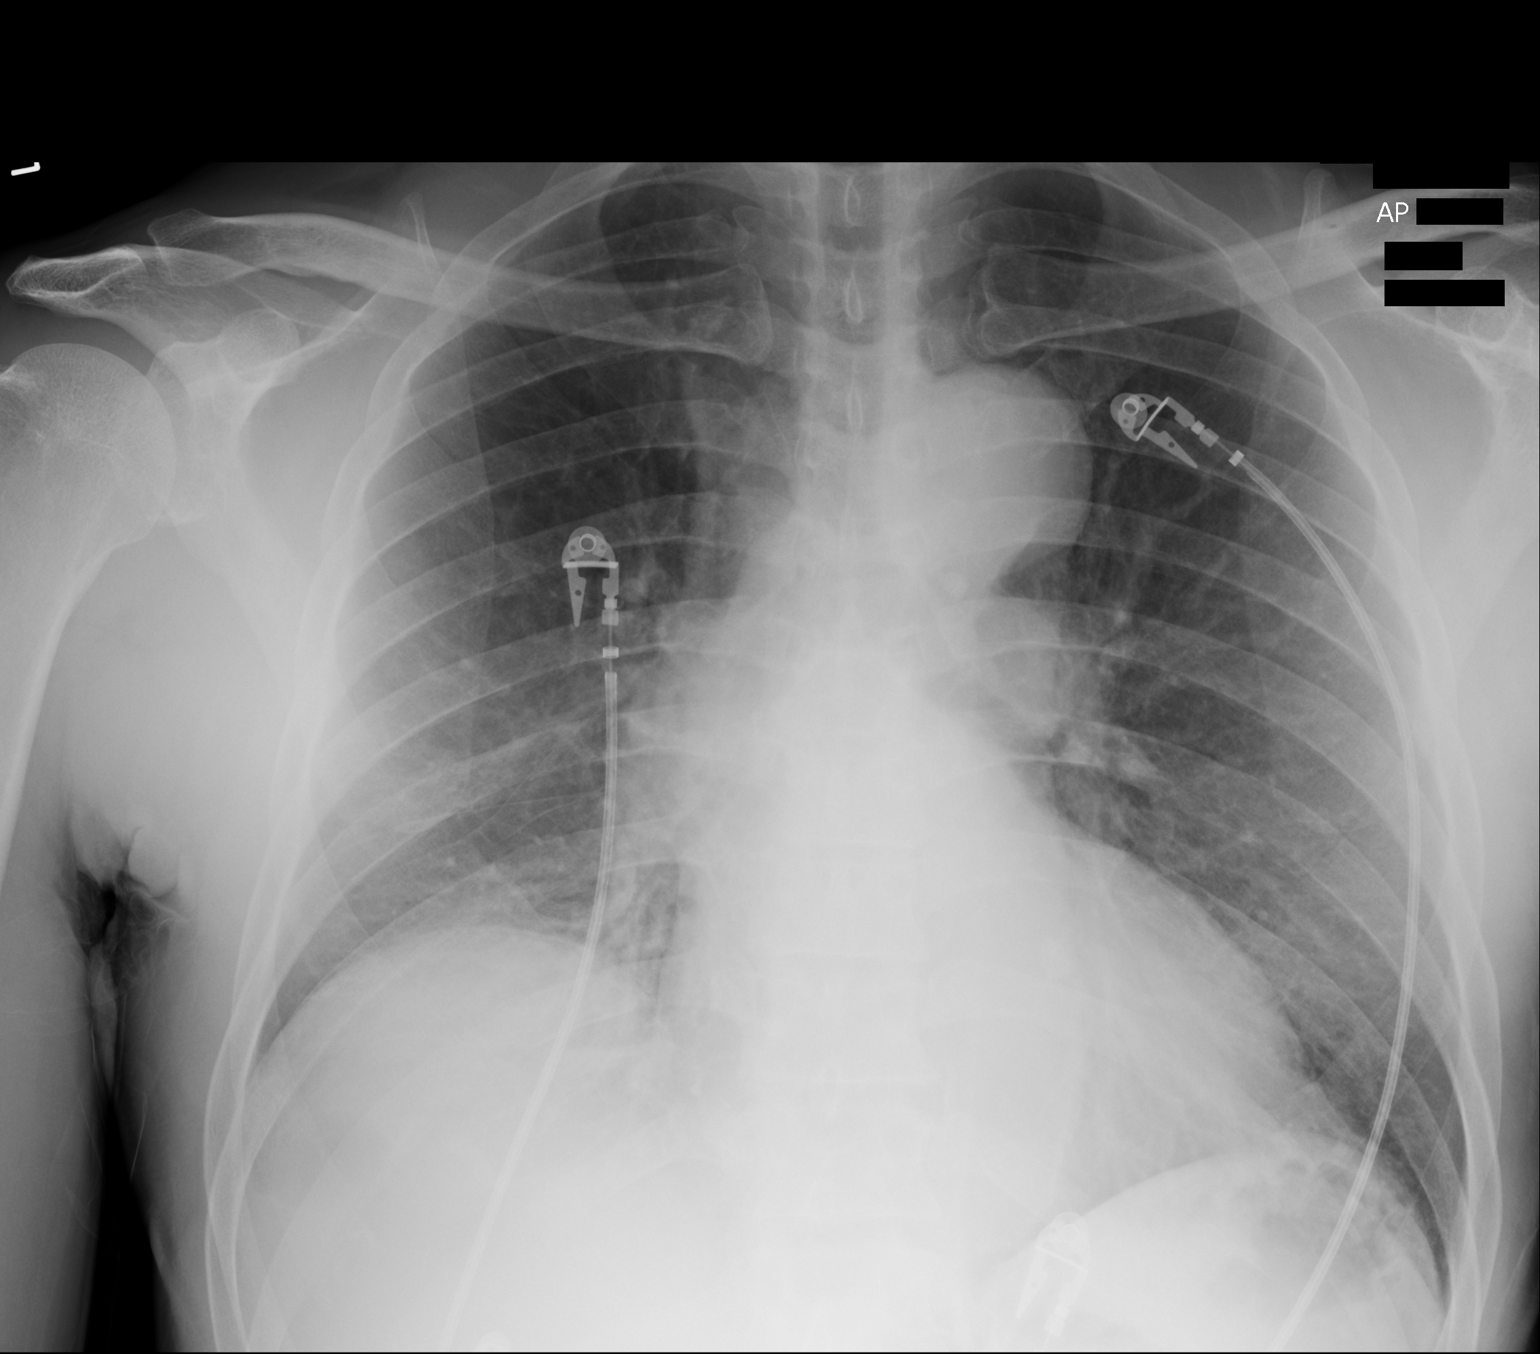

[1 of 1 positions shown; findings below may reference images not displayed]

FINDINGS: Cardiac shadow is within normal limits. There is mild right basilar
atelectasis. No focal confluent infiltrate is seen. No bony
abnormality is noted.
IMPRESSION: Mild right basilar atelectasis.

## 2015-02-07 ENCOUNTER — Telehealth: Payer: Self-pay | Admitting: Cardiovascular Disease

## 2015-02-07 NOTE — Telephone Encounter (Signed)
3 attempts to schedule from recall .  LMOV to call office for scheduling.  ° °Deleting recall.   °

## 2015-07-05 ENCOUNTER — Other Ambulatory Visit: Payer: Self-pay

## 2015-07-05 MED ORDER — LISINOPRIL 5 MG PO TABS: 5.0000 mg | ORAL_TABLET | Freq: Every day | ORAL | Status: DC

## 2015-07-05 MED ORDER — METOPROLOL TARTRATE 25 MG PO TABS: 25.0000 mg | ORAL_TABLET | Freq: Two times a day (BID) | ORAL | Status: DC

## 2015-07-05 MED ORDER — ATORVASTATIN CALCIUM 80 MG PO TABS: 40.0000 mg | ORAL_TABLET | Freq: Every day | ORAL | Status: DC

## 2017-05-14 ENCOUNTER — Emergency Department
Admission: EM | Admit: 2017-05-14 | Discharge: 2017-05-14 | Disposition: A | Discharge status: Home / Self Care | Payer: BLUE CROSS/BLUE SHIELD | Attending: Emergency Medicine | Admitting: Emergency Medicine

## 2017-05-14 ENCOUNTER — Encounter: Payer: Self-pay | Admitting: Emergency Medicine

## 2017-05-14 DIAGNOSIS — L0211 Cutaneous abscess of neck: Secondary | ICD-10-CM | POA: Diagnosis not present

## 2017-05-14 DIAGNOSIS — I2510 Atherosclerotic heart disease of native coronary artery without angina pectoris: Secondary | ICD-10-CM | POA: Diagnosis not present

## 2017-05-14 DIAGNOSIS — I252 Old myocardial infarction: Secondary | ICD-10-CM | POA: Diagnosis not present

## 2017-05-14 DIAGNOSIS — Z87891 Personal history of nicotine dependence: Secondary | ICD-10-CM | POA: Diagnosis not present

## 2017-05-14 DIAGNOSIS — Z7982 Long term (current) use of aspirin: Secondary | ICD-10-CM | POA: Diagnosis not present

## 2017-05-14 DIAGNOSIS — Z79899 Other long term (current) drug therapy: Secondary | ICD-10-CM | POA: Diagnosis not present

## 2017-05-14 DIAGNOSIS — R220 Localized swelling, mass and lump, head: Secondary | ICD-10-CM | POA: Diagnosis present

## 2017-05-14 DIAGNOSIS — I10 Essential (primary) hypertension: Secondary | ICD-10-CM | POA: Insufficient documentation

## 2017-05-14 DIAGNOSIS — L0291 Cutaneous abscess, unspecified: Secondary | ICD-10-CM

## 2017-05-14 MED ORDER — SULFAMETHOXAZOLE-TRIMETHOPRIM 800-160 MG PO TABS: 1.0000 | ORAL_TABLET | Freq: Two times a day (BID) | ORAL | 0 refills | Status: DC

## 2017-05-14 MED ORDER — HYDROCODONE-ACETAMINOPHEN 5-325 MG PO TABS: 1.0000 | ORAL_TABLET | Freq: Four times a day (QID) | ORAL | 0 refills | Status: DC | PRN

## 2017-05-14 NOTE — ED Triage Notes (Signed)
Pt with dental abscess to right side jaw.

## 2017-05-14 NOTE — Discharge Instructions (Signed)
Follow-up with [facility] or return to the emergency department if any severe worsening of your abscess.  Begin using warm moist compresses to the area frequently.  Begin taking Bactrim DS twice daily for 10 days.  You are also having a prescription for Norco if needed for moderate to severe pain especially at night while you are trying to sleep.  If area becomes soft and has not drained return to the ED for incision and drainage of this area.

## 2017-05-14 NOTE — ED Notes (Signed)
See triage note   Dental pain and gumline swelling for couple of days

## 2017-05-14 NOTE — ED Provider Notes (Signed)
[facility] Emergency Department Provider Note  ____________________________________________   First MD Initiated Contact with Patient 05/14/17 1109     (approximate)  I have reviewed the triage vital signs and the nursing notes.   HISTORY  Chief Complaint Oral Swelling   HPI Manuel Hess is a 62 y.o. male is here with complaint of right facial swelling along with a area to the back of his neck.  Patient states he has not been taking any over-the-counter medication but has been squeezing the area on the back of his neck.  Patient denies any previous history of abscesses.  He is unaware of any fever or chills.  He rates his pain as a 10/10.  Past Medical History:  Diagnosis Date  . Coronary artery disease    Non-ST elevation myocardial infarction in March of 2015. Cardiac catheterization showed an occluded third diagonal (small-diameter with left to left collaterals), 50% mid LCX and EF of 40%.   . Hyperlipidemia   . Hypertension   . MI (myocardial infarction) (HCC)   . Syncope and collapse     Patient Active Problem List   Diagnosis Date Noted  . Cardiomyopathy, ischemic 06/05/2013  . Coronary artery disease   . Hyperlipidemia     Past Surgical History:  Procedure Laterality Date  . CARDIAC CATHETERIZATION  05/2013   [facility]  . HERNIA REPAIR      Prior to Admission medications   Medication Sig Start Date End Date Taking? Authorizing Provider  aspirin 81 MG tablet Take 1 tablet (81 mg total) by mouth daily. 05/19/14   Iran Ouch, MD  atorvastatin (LIPITOR) 80 MG tablet Take 0.5 tablets (40 mg total) by mouth daily. 07/05/15   Iran Ouch, MD  HYDROcodone-acetaminophen (NORCO/VICODIN) 5-325 MG tablet Take 1 tablet by mouth every 6 (six) hours as needed for moderate pain. 05/14/17   Tommi Rumps, PA-C  lisinopril (PRINIVIL,ZESTRIL) 5 MG tablet Take 1 tablet (5 mg total) by mouth daily. 07/05/15   Iran Ouch, MD  metoprolol  tartrate (LOPRESSOR) 25 MG tablet Take 1 tablet (25 mg total) by mouth every 12 (twelve) hours. 07/05/15   Iran Ouch, MD  sulfamethoxazole-trimethoprim (BACTRIM DS,SEPTRA DS) 800-160 MG tablet Take 1 tablet by mouth 2 (two) times daily. 05/14/17   Tommi Rumps, PA-C    Allergies Patient has no known allergies.  Family History  Problem Relation Age of Onset  . Heart attack Brother 60    Social History Social History   Tobacco Use  . Smoking status: Former Smoker    Packs/day: 1.00    Years: 20.00    Pack years: 20.00    Types: Cigarettes  . Smokeless tobacco: Never Used  Substance Use Topics  . Alcohol use: Yes    Comment: socially  . Drug use: No    Comment: past    Review of Systems Constitutional: No fever/chills Cardiovascular: Denies chest pain. Respiratory: Denies shortness of breath. Gastrointestinal:   No nausea, no vomiting.   Musculoskeletal: Negative for back pain. Skin: Positive for abscess. Neurological: Negative for headaches ____________________________________________   PHYSICAL EXAM:  VITAL SIGNS: ED Triage Vitals  Enc Vitals Group     BP 05/14/17 1026 (!) 142/93     Pulse Rate 05/14/17 1026 97     Resp 05/14/17 1026 18     Temp 05/14/17 1026 98.6 F (37 C)     Temp Source 05/14/17 1026 Oral     SpO2 05/14/17  1026 99 %     Weight --      Height --      Head Circumference --      Peak Flow --      Pain Score 05/14/17 1027 10     Pain Loc --      Pain Edu? --      Excl. in GC? --    Constitutional: Alert and oriented. Well appearing and in no acute distress. Eyes: Conjunctivae are normal.  Head: Atraumatic. Nose: No congestion/rhinnorhea. Neck: No stridor.   Cardiovascular: Normal rate, regular rhythm. Grossly normal heart sounds.  Good peripheral circulation. Respiratory: Normal respiratory effort.  No retractions. Lungs CTAB. Musculoskeletal: Moves upper and lower extremities without any difficulty and normal gait was  noted. Neurologic:  Normal speech and language. No gross focal neurologic deficits are appreciated. No gait instability. Skin:  Skin is warm, dry.  Right facial area in the beard area appears that patient has had folliculitis barbae and currently has an erythematous area that is tender to palpation and extremely firm.  On examination of the left posterior scalp/neck there is also an erythematous lesion that is extremely firm to touch.  Area is tender.  There is no area of fluctuance noted. Psychiatric: Mood and affect are normal. Speech and behavior are normal.  ____________________________________________   LABS (all labs ordered are listed, but only abnormal results are displayed)  Labs Reviewed - No data to display  PROCEDURES  Procedure(s) performed: None  Procedures  Critical Care performed: No  ____________________________________________   INITIAL IMPRESSION / ASSESSMENT AND PLAN / ED COURSE Patient was encouraged to use moist compresses to both areas frequently.  He was placed on Bactrim DS twice daily for 10 days along with Norco if needed for moderate to severe pain.  Patient will return to the ED for I&D if either area becomes soft and does not drain in the next several days.  ____________________________________________   FINAL CLINICAL IMPRESSION(S) / ED DIAGNOSES  Final diagnoses:  Abscess     ED Discharge Orders        Ordered    sulfamethoxazole-trimethoprim (BACTRIM DS,SEPTRA DS) 800-160 MG tablet  2 times daily     05/14/17 1228    HYDROcodone-acetaminophen (NORCO/VICODIN) 5-325 MG tablet  Every 6 hours PRN     05/14/17 1228       Note:  This document was prepared using Dragon voice recognition software and may include unintentional dictation errors.    Tommi Rumps, PA-C 05/14/17 1303    Emily Filbert, MD 05/14/17 [phone]

## 2019-10-19 ENCOUNTER — Encounter: Payer: Self-pay | Admitting: Cardiology

## 2019-10-19 NOTE — Progress Notes (Signed)
Cardiology Office Note  Date: 10/20/2019   ID: Manuel Hess, DOB [birth year], MRN [id number]  PCP:  Elfredia Nevins, MD  Cardiologist:  Nona Dell, MD Electrophysiologist:  None   Chief Complaint  Patient presents with  . Establish cardiac follow-up    History of Present Illness: Manuel Hess is a 64 y.o. male referred for cardiology consultation by Dr. Sherwood Gambler to reestablish cardiology follow-up.  I reviewed the available records.  Chart review finds previous follow-up with Dr. Kirke Corin, last seen in 2016.  He has a history of NSTEMI in 2015, managed medically after cardiac catheterization demonstrated occlusion of a third diagonal branch which was small and associated with left to left collaterals, also moderate disease within the mid circumflex.  He has not had regular cardiology follow-up for years, not on regular medications until just recently.  He is currently on aspirin, recently started on Norvasc which he is taking at 5 mg daily.  Lab work from June showed LDL 159.  In the past he was on Lipitor at 40 mg daily, does not describe any intolerances.  He has not had a recent follow-up echocardiogram, LVEF was 35 to 40% as of 2015.  In terms of symptoms, he does not describe any active angina, no palpitations or syncope.  In general he has NYHA class II dyspnea.  He works at a [employer] as a Administrator, sports.  I personally reviewed his ECG today which shows sinus rhythm with PAC.  Past Medical History:  Diagnosis Date  . Coronary artery disease    Non-ST elevation myocardial infarction in March of 2015. Cardiac catheterization showed an occluded third diagonal (small-diameter with left to left collaterals), 50% mid LCX and EF of 40%.   . Essential hypertension   . Hyperlipidemia   . MI (myocardial infarction) [facility])     Past Surgical History:  Procedure Laterality Date  . CARDIAC CATHETERIZATION  05/2013   [facility]  . HERNIA REPAIR      Current Outpatient Medications    Medication Sig Dispense Refill  . aspirin 81 MG tablet Take 1 tablet (81 mg total) by mouth daily. 30 tablet 1  . amLODipine (NORVASC) 5 MG tablet Take 1 tablet (5 mg total) by mouth daily. 90 tablet 3  . atorvastatin (LIPITOR) 40 MG tablet Take 1 tablet (40 mg total) by mouth at bedtime. 90 tablet 3   No current facility-administered medications for this visit.   Allergies:  Patient has no known allergies.   Social History: The patient  reports that he has quit smoking. His smoking use included cigarettes. He has a 20.00 pack-year smoking history. He has never used smokeless tobacco. He reports current alcohol use. He reports that he does not use drugs.   Family History: The patient's family history includes Heart attack (age of onset: 22) in his brother.   ROS:  Please see the history of present illness. Otherwise, complete review of systems is positive for none.  All other systems are reviewed and negative.   Physical Exam: VS:  BP (!) 120/92   Pulse 80   Ht 5\' 10"  (1.778 m)   Wt 172 lb 9.6 oz (78.3 kg)   SpO2 100%   BMI 24.77 kg/m , BMI Body mass index is 24.77 kg/m.  Wt Readings from Last 3 Encounters:  10/20/19 172 lb 9.6 oz (78.3 kg)  06/17/14 178 lb (80.7 kg)  09/10/13 170 lb (77.1 kg)    General: Patient appears comfortable at rest. HEENT:  Conjunctiva and lids normal, wearing a mask. Neck: Supple, no elevated JVP or carotid bruits, no thyromegaly. Lungs: Clear to auscultation, nonlabored breathing at rest. Cardiac: Regular rate and rhythm with occasional ectopy, no S3, soft systolic murmur, no pericardial rub. Abdomen: Soft, nontender, bowel sounds present. Extremities: No pitting edema, distal pulses 2+. Skin: Warm and dry. Musculoskeletal: No kyphosis. Neuropsychiatric: Alert and oriented x3, affect grossly appropriate.  ECG:  An ECG dated 06/17/2014 was personally reviewed today and demonstrated:  Normal sinus rhythm.  Recent Labwork: No results found for  requested labs within last 8760 hours.     Component Value Date/Time   CHOL 143 07/06/2013 0848   CHOL 148 06/01/2013 0319   TRIG 99 07/06/2013 0848   TRIG 76 06/01/2013 0319   HDL 38 (L) 07/06/2013 0848   HDL 37 (L) 06/01/2013 0319   CHOLHDL 3.8 07/06/2013 0848   VLDL 15 06/01/2013 0319   LDLCALC 85 07/06/2013 0848   LDLCALC 96 06/01/2013 0319    Other Studies Reviewed Today:  Echocardiogram 05/31/2013: Summary:   1. Left ventricular ejection fraction, by visual estimation, is 35 to  40%.   2. Moderately decreased global left ventricular systolic function.   3. Diffuse hypokinesis. Unable to exclude regional wall motion  abnormality.   4. Diastolic relaxation abnormality noted.   5. Normal right ventricular size and systolic function   6. Mild dilatation of the aortic root and ascending aorta.   7. Mild mitral valve regurgitation.   8. Mild tricuspid regurgitation.   9. Normal RVSP.   Assessment and Plan:  1.  CAD with history of NSTEMI in March 2015.  Cardiac catheterization at that time revealed an occluded small third diagonal branch associated with left to left collaterals and also moderate mid circumflex stenosis, both of which were managed medically.  He does not describe any active angina at this time.  Continue aspirin 81 mg daily, agree with resumption of Norvasc 5 mg daily for now.  He does have a history of apparent ischemic cardiomyopathy with LVEF 35 to 40% in 2015, we will arrange a follow-up echocardiogram for reevaluation.  He may need further medication adjustments.  Resume statin therapy as well.  2.  Mixed hyperlipidemia, recent LDL 159.  Resuming Lipitor 40 mg daily.  3.  Essential hypertension, blood pressure significantly increased when he first came in, settled down on my check after about 20 minutes.  Continue Norvasc 5 mg daily, may need either further up titration or broadening of medical therapy depending on echocardiogram results.  Medication  Adjustments/Labs and Tests Ordered: Current medicines are reviewed at length with the patient today.  Concerns regarding medicines are outlined above.   Tests Ordered: Orders Placed This Encounter  Procedures  . EKG 12-Lead  . ECHOCARDIOGRAM COMPLETE    Medication Changes: Meds ordered this encounter  Medications  . amLODipine (NORVASC) 5 MG tablet    Sig: Take 1 tablet (5 mg total) by mouth daily.    Dispense:  90 tablet    Refill:  3  . atorvastatin (LIPITOR) 40 MG tablet    Sig: Take 1 tablet (40 mg total) by mouth at bedtime.    Dispense:  90 tablet    Refill:  3    Disposition:  Follow up test results and determine disposition.  Signed, Jonelle Sidle, MD, [facility] 10/20/2019 2:58 PM    [facility] at [facility] [address] [place], [place] [address] Phone: [phone]; Fax: [phone])  [phone] 

## 2019-10-20 ENCOUNTER — Other Ambulatory Visit: Payer: Self-pay

## 2019-10-20 ENCOUNTER — Ambulatory Visit (INDEPENDENT_AMBULATORY_CARE_PROVIDER_SITE_OTHER): Payer: BLUE CROSS/BLUE SHIELD | Admitting: Cardiology

## 2019-10-20 ENCOUNTER — Encounter: Payer: Self-pay | Admitting: Cardiology

## 2019-10-20 VITALS — BP 120/92 | HR 80 | Ht 70.0 in | Wt 172.6 lb

## 2019-10-20 DIAGNOSIS — E782 Mixed hyperlipidemia: Secondary | ICD-10-CM

## 2019-10-20 DIAGNOSIS — I10 Essential (primary) hypertension: Secondary | ICD-10-CM | POA: Diagnosis not present

## 2019-10-20 DIAGNOSIS — Z8679 Personal history of other diseases of the circulatory system: Secondary | ICD-10-CM | POA: Diagnosis not present

## 2019-10-20 DIAGNOSIS — I25119 Atherosclerotic heart disease of native coronary artery with unspecified angina pectoris: Secondary | ICD-10-CM | POA: Diagnosis not present

## 2019-10-20 MED ORDER — AMLODIPINE BESYLATE 5 MG PO TABS: 5.0000 mg | ORAL_TABLET | Freq: Every day | ORAL | 3 refills | Status: DC

## 2019-10-20 MED ORDER — ATORVASTATIN CALCIUM 40 MG PO TABS: 40.0000 mg | ORAL_TABLET | Freq: Every day | ORAL | 3 refills | Status: DC

## 2019-10-20 NOTE — Patient Instructions (Signed)
Medication Instructions:  START ATORVASTATIN 40 MG DAILY AT BEDTIME  TAKE AMLODIPINE 5 MG DAILY   *If you need a refill on your cardiac medications before your next appointment, please call your pharmacy*   Lab Work: NONE TODAY If you have labs (blood work) drawn today and your tests are completely normal, you will receive your results only by: Marland Kitchen MyChart Message (if you have MyChart) OR . A paper copy in the mail If you have any lab test that is abnormal or we need to change your treatment, we will call you to review the results.   Testing/Procedures: Your physician has requested that you have an echocardiogram. Echocardiography is a painless test that uses sound waves to create images of your heart. It provides your doctor with information about the size and shape of your heart and how well your heart's chambers and valves are working. This procedure takes approximately one hour. There are no restrictions for this procedure.     Follow-Up: At [facility], you and your health needs are our priority.  As part of our continuing mission to provide you with exceptional heart care, we have created designated Provider Care Teams.  These Care Teams include your primary Cardiologist (physician) and Advanced Practice Providers (APPs -  Physician Assistants and Nurse Practitioners) who all work together to provide you with the care you need, when you need it.  We recommend signing up for the patient portal called "MyChart".  Sign up information is provided on this After Visit Summary.  MyChart is used to connect with patients for Virtual Visits (Telemedicine).  Patients are able to view lab/test results, encounter notes, upcoming appointments, etc.  Non-urgent messages can be sent to your provider as well.   To learn more about what you can do with MyChart, go to [email or web address].    Your next appointment:  WE WILL CALL YOU WITH RESULTS.        Thank you for choosing [facility] !

## 2019-10-28 ENCOUNTER — Ambulatory Visit (HOSPITAL_COMMUNITY)
Admission: RE | Admit: 2019-10-28 | Discharge: 2019-10-28 | Disposition: A | Discharge status: Home / Self Care | Payer: BLUE CROSS/BLUE SHIELD | Source: Ambulatory Visit | Attending: Cardiology | Admitting: Cardiology

## 2019-10-28 ENCOUNTER — Other Ambulatory Visit: Payer: Self-pay

## 2019-10-28 DIAGNOSIS — Z8679 Personal history of other diseases of the circulatory system: Secondary | ICD-10-CM | POA: Insufficient documentation

## 2019-10-28 LAB — ECHOCARDIOGRAM COMPLETE
AR max vel: 2.67 cm2
AV Area VTI: 2.43 cm2
AV Area mean vel: 2.87 cm2
AV Mean grad: 2.7 mmHg
AV Peak grad: 5.2 mmHg
Ao pk vel: 1.14 m/s
Area-P 1/2: 1.45 cm2
Calc EF: 46.2 %
S' Lateral: 4.67 cm
Single Plane A2C EF: 47.7 %
Single Plane A4C EF: 44.1 %

## 2019-10-28 NOTE — Progress Notes (Signed)
*  PRELIMINARY RESULTS* Echocardiogram 2D Echocardiogram has been performed.  Manuel Hess 10/28/2019, 3:49 PM

## 2019-10-29 ENCOUNTER — Telehealth: Payer: Self-pay

## 2019-10-29 MED ORDER — METOPROLOL SUCCINATE ER 25 MG PO TB24: 25.0000 mg | ORAL_TABLET | Freq: Every day | ORAL | 3 refills | Status: DC

## 2019-10-29 NOTE — Telephone Encounter (Signed)
I spoke with patient.He will stop Amlodipine and start Toprol XL 25 mg daily and he asked I leave f/u apt on his answering machine.Follow up on 9/10 at 330 pm with Lady Saucier

## 2019-10-29 NOTE — Telephone Encounter (Signed)
-----   Message from Ellsworth Lennox, [place] sent at 10/29/2019 12:38 PM EDT ----- Patient of Dr. Diona Browner - Please let the patient know his echocardiogram showed his EF remains reduced. Normal is 55-65% and his is at 35-40% which is similar to prior imaging from 2015. Would recommend stopping Amlodipine and starting Toprol-XL 25mg  daily as this will help with BP and with the pumping function of his heart. Would arrange for closer follow-up with Dr. or an APP in 3-4 weeks for continued medication adjustment. Can hopefully add an ARB at that time if BP allows.

## 2019-11-26 NOTE — Progress Notes (Signed)
Cardiology Office Note   Date:  11/27/2019   ID:  Manuel Hess, DOB [birth year], MRN [id number]  PCP:  Elfredia Nevins, MD  Cardiologist:  Dr. Diona Browner    Chief Complaint  Patient presents with  . Cardiomyopathy      History of Present Illness: Manuel Hess is a 64 y.o. male who presents for cardiomyopathy   Chart review finds previous follow-up with Dr. Kirke Corin, last seen in 2016.  He has a history of NSTEMI in 2015, managed medically after cardiac catheterization demonstrated occlusion of a third diagonal branch which was small and associated with left to left collaterals, also moderate disease within the mid circumflex.  He has not had regular cardiology follow-up for years, not on regular medications until just recently.  He is currently on aspirin, recently started on Norvasc which he is taking at 5 mg daily.  Lab work from June showed LDL 159.  In the past he was on Lipitor at 40 mg daily, does not describe any intolerances.  He has not had a recent follow-up echocardiogram, LVEF was 35 to 40% as of 2015.  In terms of symptoms, he does not describe any active angina, no palpitations or syncope.  In general he has NYHA class II dyspnea.  He works at a [employer] as a Administrator, sports.  Echo was ordered and EF remains decreased at 35-40%  Amlodipine stopped and Toprol XL started  Needs ARB  Today he is doing well, no chest pain and no SOB.  He walks all day at work without any symptoms and no lower ext edema.  His lipitor was increased by his PCP.   We reviewed echo.      Past Medical History:  Diagnosis Date  . Coronary artery disease    Non-ST elevation myocardial infarction in March of 2015. Cardiac catheterization showed an occluded third diagonal (small-diameter with left to left collaterals), 50% mid LCX and EF of 40%.   . Essential hypertension   . Hyperlipidemia   . MI (myocardial infarction) [facility])     Past Surgical History:  Procedure Laterality Date  .  CARDIAC CATHETERIZATION  05/2013   [facility]  . HERNIA REPAIR       Current Outpatient Medications  Medication Sig Dispense Refill  . aspirin 81 MG tablet Take 1 tablet (81 mg total) by mouth daily. 30 tablet 1  . atorvastatin (LIPITOR) 40 MG tablet Take 1 tablet (40 mg total) by mouth at bedtime. 90 tablet 3  . metoprolol succinate (TOPROL XL) 25 MG 24 hr tablet Take 1 tablet (25 mg total) by mouth daily. 90 tablet 3  . losartan (COZAAR) 50 MG tablet Take 1 tablet (50 mg total) by mouth daily. 90 tablet 3   No current facility-administered medications for this visit.    Allergies:   Patient has no known allergies.    Social History:  The patient  reports that he has quit smoking. His smoking use included cigarettes. He has a 20.00 pack-year smoking history. He has never used smokeless tobacco. He reports previous alcohol use. He reports that he does not use drugs.   Family History:  The patient's family history includes Heart attack (age of onset: 34) in his brother.    ROS:  General:no colds or fevers,wt stable Skin:no rashes or ulcers HEENT:no blurred vision, no congestion CV:see HPI PUL:see HPI GI:no diarrhea constipation or melena, no indigestion GU:no hematuria, no dysuria MS:no joint pain, no claudication Neuro:no syncope, no lightheadedness Endo:no diabetes,  no thyroid disease  Wt Readings from Last 3 Encounters:  11/27/19 169 lb (76.7 kg)  10/20/19 172 lb 9.6 oz (78.3 kg)  06/17/14 178 lb (80.7 kg)     PHYSICAL EXAM: VS:  BP 124/82   Pulse 80   Ht 5\' 10"  (1.778 m)   Wt 169 lb (76.7 kg)   SpO2 96%   BMI 24.25 kg/m  , BMI Body mass index is 24.25 kg/m. General:Pleasant affect, NAD Skin:Warm and dry, brisk capillary refill HEENT:normocephalic, sclera clear, mucus membranes moist Neck:supple, no JVD, no bruits  Heart:S1S2 RRR without murmur, gallup, rub or click Lungs:clear without rales, rhonchi, or wheezes , non tender, + BS, do not palpate liver  spleen or masses Ext:no lower ext edema, 2+ pedal pulses, 2+ radial pulses Neuro:alert and oriented X 3, MAE, follows commands, + facial symmetry    EKG:  EKG is NOT ordered today.    Recent Labs: No results found for requested labs within last 8760 hours.    Lipid Panel    Component Value Date/Time   CHOL 143 07/06/2013 0848   CHOL 148 06/01/2013 0319   TRIG 99 07/06/2013 0848   TRIG 76 06/01/2013 0319   HDL 38 (L) 07/06/2013 0848   HDL 37 (L) 06/01/2013 0319   CHOLHDL 3.8 07/06/2013 0848   VLDL 15 06/01/2013 0319   LDLCALC 85 07/06/2013 0848   LDLCALC 96 06/01/2013 0319       Other studies Reviewed: Additional studies/ records that were reviewed today include: . Echo 10/28/19 IMPRESSIONS    1. Left ventricular ejection fraction, by estimation, is 35 to 40%. The  left ventricle has moderately decreased function. The left ventricle  demonstrates global hypokinesis. Left ventricular diastolic parameters are  consistent with Grade I diastolic  dysfunction (impaired relaxation).  2. Right ventricular systolic function is normal. The right ventricular  size is mildly enlarged. There is normal pulmonary artery systolic  pressure.  3. The mitral valve is normal in structure. No evidence of mitral valve  regurgitation. No evidence of mitral stenosis.  4. The aortic valve is tricuspid. Aortic valve regurgitation is not  visualized. No aortic stenosis is present.  5. Aortic dilatation noted. 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  6. The inferior vena cava is normal in size with greater than 50%  respiratory variability, suggesting right atrial pressure of 3 mmHg.   FINDINGS  Left Ventricle: Left ventricular ejection fraction, by estimation, is 35  to 40%. The left ventricle has moderately decreased function. The left  ventricle demonstrates global hypokinesis. The left ventricular internal  cavity size was normal in size.  There is no  left ventricular hypertrophy. Left ventricular diastolic  parameters are consistent with Grade I diastolic dysfunction (impaired  relaxation). Normal left ventricular filling pressure.   Right Ventricle: The right ventricular size is mildly enlarged. No  increase in right ventricular wall thickness. Right ventricular systolic  function is normal. There is normal pulmonary artery systolic pressure.  The tricuspid regurgitant velocity is 2.38  m/s, and with an assumed right atrial pressure of 3 mmHg, the estimated  right ventricular systolic pressure is 25.7 mmHg.   Left Atrium: Left atrial size was normal in size.   Right Atrium: Right atrial size was normal in size.   Pericardium: There is no evidence of pericardial effusion.   Mitral Valve: The mitral valve is normal in structure. No evidence of  mitral valve regurgitation. No evidence of mitral valve  stenosis.   Tricuspid Valve: The tricuspid valve is normal in structure. Tricuspid  valve regurgitation is mild . No evidence of tricuspid stenosis.   Aortic Valve: The aortic valve is tricuspid. Aortic valve regurgitation is  not visualized. No aortic stenosis is present. Aortic valve mean gradient  measures 2.7 mmHg. Aortic valve peak gradient measures 5.2 mmHg. Aortic  valve area, by VTI measures 2.43  cm.   Pulmonic Valve: The pulmonic valve was not well visualized. Pulmonic valve  regurgitation is not visualized. No evidence of pulmonic stenosis.   Aorta: Aortic dilatation noted. There is mild dilatation of the aortic  root measuring 41 mm.   Venous: The inferior vena cava is normal in size with greater than 50%  respiratory variability, suggesting right atrial pressure of 3 mmHg.   IAS/Shunts: No atrial level shunt detected by color flow Doppler.    Cardiac cath 06/01/13   1 vessel CAD occlusion 3rd diag and is culprit for MI.  But small vessel unble to intervene, other CAD of 20% in LAD and RCA.    ASSESSMENT AND  PLAN:  1.  Cardiomyopathy - ischemic post NSTEMI in 2015.  EF continues 35-40% amlodipine stopped and BB added and will add losartan 50 mg today. Check labs in 1 week.  He had labs with PCP and were told they were good.  I doubt his BP will allow more meds but will see him back in 3 months.  Discussed signs of CHF.   2.  HTN now controlled continue meds  3.  HLD mixed, lipitor increased to 40 followed by PCP.    4.  CAD with stenosis of 3 rd diag but too small to intervene no angina and is active all day.     Current medicines are reviewed with the patient today.  The patient Has no concerns regarding medicines.  The following changes have been made:  See above Labs/ tests ordered today include:see above  Disposition:   FU:  see above  Signed, Nada Boozer, NP  11/27/2019 4:28 PM    [facility] [address] [place], [place], [place]  [address]/ [address] [place], [place] Phone: [phone]; Fax: [phone]  [phone]

## 2019-11-27 ENCOUNTER — Encounter: Payer: Self-pay | Admitting: Cardiology

## 2019-11-27 ENCOUNTER — Other Ambulatory Visit: Payer: Self-pay

## 2019-11-27 ENCOUNTER — Ambulatory Visit (INDEPENDENT_AMBULATORY_CARE_PROVIDER_SITE_OTHER): Payer: BLUE CROSS/BLUE SHIELD | Admitting: Cardiology

## 2019-11-27 VITALS — BP 124/82 | HR 80 | Ht 70.0 in | Wt 169.0 lb

## 2019-11-27 DIAGNOSIS — I255 Ischemic cardiomyopathy: Secondary | ICD-10-CM | POA: Diagnosis not present

## 2019-11-27 DIAGNOSIS — Z79899 Other long term (current) drug therapy: Secondary | ICD-10-CM

## 2019-11-27 DIAGNOSIS — I2510 Atherosclerotic heart disease of native coronary artery without angina pectoris: Secondary | ICD-10-CM

## 2019-11-27 DIAGNOSIS — E782 Mixed hyperlipidemia: Secondary | ICD-10-CM

## 2019-11-27 MED ORDER — LOSARTAN POTASSIUM 50 MG PO TABS: 50.0000 mg | ORAL_TABLET | Freq: Every day | ORAL | 3 refills | Status: DC

## 2019-11-27 NOTE — Patient Instructions (Signed)
Medication Instructions:  Your physician has recommended you make the following change in your medication:   Losartan 50 mg Daily   *If you need a refill on your cardiac medications before your next appointment, please call your pharmacy*   Lab Work: Your physician recommends that you return for lab work in: 1 Week   If you have labs (blood work) drawn today and your tests are completely normal, you will receive your results only by: Marland Kitchen MyChart Message (if you have MyChart) OR . A paper copy in the mail If you have any lab test that is abnormal or we need to change your treatment, we will call you to review the results.   Testing/Procedures: NONE     Follow-Up: At [facility], you and your health needs are our priority.  As part of our continuing mission to provide you with exceptional heart care, we have created designated Provider Care Teams.  These Care Teams include your primary Cardiologist (physician) and Advanced Practice Providers (APPs -  Physician Assistants and Nurse Practitioners) who all work together to provide you with the care you need, when you need it.  We recommend signing up for the patient portal called "MyChart".  Sign up information is provided on this After Visit Summary.  MyChart is used to connect with patients for Virtual Visits (Telemedicine).  Patients are able to view lab/test results, encounter notes, upcoming appointments, etc.  Non-urgent messages can be sent to your provider as well.   To learn more about what you can do with MyChart, go to [email or web address].    Your next appointment:   3 month(s)  The format for your next appointment:   In Person  Provider:   You may see Nona Dell, MD or one of the following Advanced Practice Providers on your designated Care Team:    Randall An, PA-C   Jacolyn Reedy, PA-C     Other Instructions Thank you for choosing [facility]!

## 2019-12-07 ENCOUNTER — Other Ambulatory Visit (HOSPITAL_COMMUNITY)
Admission: RE | Admit: 2019-12-07 | Discharge: 2019-12-07 | Disposition: A | Discharge status: Home / Self Care | Payer: BLUE CROSS/BLUE SHIELD | Source: Ambulatory Visit | Attending: Cardiology | Admitting: Cardiology

## 2019-12-07 ENCOUNTER — Other Ambulatory Visit: Payer: Self-pay

## 2019-12-07 DIAGNOSIS — Z79899 Other long term (current) drug therapy: Secondary | ICD-10-CM | POA: Diagnosis not present

## 2019-12-07 LAB — BASIC METABOLIC PANEL
Anion gap: 10 (ref 5–15)
BUN: 14 mg/dL (ref 8–23)
CO2: 26 mmol/L (ref 22–32)
Calcium: 9.1 mg/dL (ref 8.9–10.3)
Chloride: 103 mmol/L (ref 98–111)
Creatinine, Ser: 1.35 mg/dL — ABNORMAL HIGH (ref 0.61–1.24)
GFR calc Af Amer: >60 mL/min (ref >60)
GFR calc non Af Amer: 55 mL/min — ABNORMAL LOW (ref >60)
Glucose, Bld: 89 mg/dL (ref 70–99)
Potassium: 3.6 mmol/L (ref 3.5–5.1)
Sodium: 139 mmol/L (ref 135–145)

## 2019-12-09 ENCOUNTER — Telehealth: Payer: Self-pay | Admitting: *Deleted

## 2019-12-09 DIAGNOSIS — Z79899 Other long term (current) drug therapy: Secondary | ICD-10-CM

## 2019-12-09 NOTE — Telephone Encounter (Signed)
-----   Message from Leone Brand, NP sent at 12/08/2019 11:04 AM EDT ----- Labs ok but to be sure that medication is stable for him repeat BMP in 1 week.  Thanks.

## 2019-12-09 NOTE — Telephone Encounter (Signed)
Order placed, pt notified 

## 2019-12-18 ENCOUNTER — Other Ambulatory Visit: Payer: Self-pay

## 2019-12-18 ENCOUNTER — Other Ambulatory Visit (HOSPITAL_COMMUNITY)
Admission: RE | Admit: 2019-12-18 | Discharge: 2019-12-18 | Disposition: A | Discharge status: Home / Self Care | Payer: BLUE CROSS/BLUE SHIELD | Source: Ambulatory Visit | Attending: Cardiology | Admitting: Cardiology

## 2019-12-18 DIAGNOSIS — Z79899 Other long term (current) drug therapy: Secondary | ICD-10-CM | POA: Insufficient documentation

## 2019-12-18 LAB — BASIC METABOLIC PANEL
Anion gap: 9 (ref 5–15)
BUN: 12 mg/dL (ref 8–23)
CO2: 28 mmol/L (ref 22–32)
Calcium: 9.2 mg/dL (ref 8.9–10.3)
Chloride: 102 mmol/L (ref 98–111)
Creatinine, Ser: 1.37 mg/dL — ABNORMAL HIGH (ref 0.61–1.24)
GFR calc Af Amer: >60 mL/min (ref >60)
GFR calc non Af Amer: 55 mL/min — ABNORMAL LOW (ref >60)
Glucose, Bld: 92 mg/dL (ref 70–99)
Potassium: 3.3 mmol/L — ABNORMAL LOW (ref 3.5–5.1)
Sodium: 139 mmol/L (ref 135–145)

## 2019-12-22 ENCOUNTER — Telehealth: Payer: Self-pay | Admitting: Student

## 2019-12-22 ENCOUNTER — Telehealth: Payer: Self-pay

## 2019-12-22 DIAGNOSIS — Z79899 Other long term (current) drug therapy: Secondary | ICD-10-CM

## 2019-12-22 MED ORDER — POTASSIUM CHLORIDE CRYS ER 20 MEQ PO TBCR: EXTENDED_RELEASE_TABLET | ORAL | 0 refills | Status: DC

## 2019-12-22 NOTE — Telephone Encounter (Signed)
I already spoke with patient.

## 2019-12-22 NOTE — Telephone Encounter (Signed)
Attempt to reach, left message to return call. 

## 2019-12-22 NOTE — Telephone Encounter (Signed)
-----   Message from Leone Brand, NP sent at 12/21/2019  3:32 PM EDT ----- Have pt take KDUR 40 meq today, only then recheck BMP in 1 week.  Thanks.

## 2019-12-22 NOTE — Telephone Encounter (Signed)
I spoke with patient.he will take potassium 40 meq today and stop by next week for bmet.

## 2019-12-22 NOTE — Telephone Encounter (Signed)
New message     Patient called and was wanting to speak to someone for his lab work results

## 2019-12-25 ENCOUNTER — Encounter: Payer: Self-pay | Admitting: *Deleted

## 2019-12-29 ENCOUNTER — Other Ambulatory Visit: Payer: Self-pay

## 2019-12-29 ENCOUNTER — Other Ambulatory Visit (HOSPITAL_COMMUNITY)
Admission: RE | Admit: 2019-12-29 | Discharge: 2019-12-29 | Disposition: A | Discharge status: Home / Self Care | Payer: BLUE CROSS/BLUE SHIELD | Source: Ambulatory Visit | Attending: Cardiology | Admitting: Cardiology

## 2019-12-29 DIAGNOSIS — Z79899 Other long term (current) drug therapy: Secondary | ICD-10-CM | POA: Insufficient documentation

## 2019-12-29 LAB — BASIC METABOLIC PANEL
Anion gap: 8 (ref 5–15)
BUN: 14 mg/dL (ref 8–23)
CO2: 28 mmol/L (ref 22–32)
Calcium: 9.4 mg/dL (ref 8.9–10.3)
Chloride: 103 mmol/L (ref 98–111)
Creatinine, Ser: 1.36 mg/dL — ABNORMAL HIGH (ref 0.61–1.24)
GFR, Estimated: 55 mL/min — ABNORMAL LOW (ref >60)
Glucose, Bld: 93 mg/dL (ref 70–99)
Potassium: 3.6 mmol/L (ref 3.5–5.1)
Sodium: 139 mmol/L (ref 135–145)

## 2020-02-25 NOTE — Progress Notes (Signed)
Cardiology Office Note    Date:  02/27/2020   ID:  HAJI GALLUCCIO, DOB February 27, 1956, MRN [id number]  PCP:  Elfredia Nevins, MD  Cardiologist: Nona Dell, MD    Chief Complaint  Patient presents with  . Follow-up    3 month visit    History of Present Illness:    Manuel Hess is a 64 y.o. male with past medical history of CAD (s/p NTEMI in 2015 with cath showing occluded 3rd diagonal with medical management recommended), chronic combined systolic and diastolic CHF (EF 95-32% by echo in 2015), HTN and HLD who presents to the office today for 15-month follow-up.  He was last examined by Nada Boozer, NP in 11/2019 and recent echocardiogram had shown that his EF remained reduced at 35 to 40%. He denied any recent chest pain or dyspnea on exertion and was remaining active at baseline. He was continued on Toprol-XL 25 mg daily and Losartan 50 mg daily was added to his medication regimen for his cardiomyopathy. Follow-up labs showed his kidney function remained stable but K+ was low at 3.3. He was started on potassium supplementation and follow-up labs on 12/29/2019 showed K+ had normalized to 3.6 and creatinine remained stable at 1.36.  In talking with the patient today, he reports overall doing well since his last visit. He is on his feet for 8+ hours a day at his job and denies any recent chest pain or dyspnea on exertion. No recent orthopnea, PND or lower extremity edema. He does not exercise regularly at home but is planning to start using a family member's exercise bike to increase his activity level.   Past Medical History:  Diagnosis Date  . CHF (congestive heart failure) (HCC)    a. EF was at 35-40% by echo in 2015 b. similar results by repeat imaging in 10/2019  . Coronary artery disease    Non-ST elevation myocardial infarction in March of 2015. Cardiac catheterization showed an occluded third diagonal (small-diameter with left to left collaterals), 50% mid LCX and EF of 40%.   .  Essential hypertension   . Hyperlipidemia   . MI (myocardial infarction) [facility])     Past Surgical History:  Procedure Laterality Date  . CARDIAC CATHETERIZATION  05/2013   [facility]  . HERNIA REPAIR      Current Medications: Outpatient Medications Prior to Visit  Medication Sig Dispense Refill  . aspirin 81 MG tablet Take 1 tablet (81 mg total) by mouth daily. 30 tablet 1  . losartan (COZAAR) 50 MG tablet Take 1 tablet (50 mg total) by mouth daily. 90 tablet 3  . metoprolol succinate (TOPROL XL) 25 MG 24 hr tablet Take 1 tablet (25 mg total) by mouth daily. 90 tablet 3  . potassium chloride SA (KLOR-CON M20) 20 MEQ tablet Take 2 tablets (40 meq) today 2 tablet 0  . atorvastatin (LIPITOR) 40 MG tablet Take 1 tablet (40 mg total) by mouth at bedtime. 90 tablet 3   No facility-administered medications prior to visit.     Allergies:   Patient has no known allergies.   Social History   Socioeconomic History  . Marital status: Single    Spouse name: Not on file  . Number of children: Not on file  . Years of education: Not on file  . Highest education level: Not on file  Occupational History  . Not on file  Tobacco Use  . Smoking status: Former Smoker    Packs/day: 1.00  Years: 20.00    Pack years: 20.00    Types: Cigarettes  . Smokeless tobacco: Never Used  Vaping Use  . Vaping Use: Never used  Substance and Sexual Activity  . Alcohol use: Not Currently    Comment: socially  . Drug use: No    Types: Marijuana    Comment: past  . Sexual activity: Not on file  Other Topics Concern  . Not on file  Social History Narrative  . Not on file   Social Determinants of Health   Financial Resource Strain: Not on file  Food Insecurity: Not on file  Transportation Needs: Not on file  Physical Activity: Not on file  Stress: Not on file  Social Connections: Not on file     Family History:  The patient's family history includes Heart attack (age of onset: 43) in his brother.    Review of Systems:   Please see the history of present illness.     General:  No chills, fever, night sweats or weight changes.  Cardiovascular:  No chest pain, dyspnea on exertion, edema, orthopnea, palpitations, paroxysmal nocturnal dyspnea. Dermatological: No rash, lesions/masses Respiratory: No cough, dyspnea Urologic: No hematuria, dysuria Abdominal:   No nausea, vomiting, diarrhea, bright red blood per rectum, melena, or hematemesis Neurologic:  No visual changes, wkns, changes in mental status. All other systems reviewed and are otherwise negative except as noted above.   Physical Exam:    VS:  BP 120/80   Pulse 94   Ht 5\' 10"  (1.778 m)   Wt 171 lb 3.2 oz (77.7 kg)   SpO2 96%   BMI 24.56 kg/m    General: Well developed, well nourished,male appearing in no acute distress. Head: Normocephalic, atraumatic. Neck: No carotid bruits. JVD not elevated.  Lungs: Respirations regular and unlabored, without wheezes or rales.  Heart: Regular rate and rhythm. No S3 or S4.  No murmur, no rubs, or gallops appreciated. Abdomen: Appears non-distended. No obvious abdominal masses. Msk:  Strength and tone appear normal for age. No obvious joint deformities or effusions. Extremities: No clubbing or cyanosis. No edema.  Distal pedal pulses are 2+ bilaterally. Neuro: Alert and oriented X 3. Moves all extremities spontaneously. No focal deficits noted. Psych:  Responds to questions appropriately with a normal affect. Skin: No rashes or lesions noted  Wt Readings from Last 3 Encounters:  02/26/20 171 lb 3.2 oz (77.7 kg)  11/27/19 169 lb (76.7 kg)  10/20/19 172 lb 9.6 oz (78.3 kg)     Studies/Labs Reviewed:   EKG:  EKG is not ordered today.   Recent Labs: 12/29/2019: BUN 14; Creatinine, Ser 1.36; Potassium 3.6; Sodium 139   Lipid Panel    Component Value Date/Time   CHOL 143 07/06/2013 0848   CHOL 148 06/01/2013 0319   TRIG 99 07/06/2013 0848   TRIG 76 06/01/2013 0319   HDL 38  (L) 07/06/2013 0848   HDL 37 (L) 06/01/2013 0319   CHOLHDL 3.8 07/06/2013 0848   VLDL 15 06/01/2013 0319   LDLCALC 85 07/06/2013 0848   LDLCALC 96 06/01/2013 0319    Additional studies/ records that were reviewed today include:   Echocardiogram: 10/2019 IMPRESSIONS    1. Left ventricular ejection fraction, by estimation, is 35 to 40%. The  left ventricle has moderately decreased function. The left ventricle  demonstrates global hypokinesis. Left ventricular diastolic parameters are  consistent with Grade I diastolic  dysfunction (impaired relaxation).  2. Right ventricular systolic function is normal. The right ventricular  size is mildly enlarged. There is normal pulmonary artery systolic  pressure.  3. The mitral valve is normal in structure. No evidence of mitral valve  regurgitation. No evidence of mitral stenosis.  4. The aortic valve is tricuspid. Aortic valve regurgitation is not  visualized. No aortic stenosis is present.  5. Aortic dilatation noted. 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  6. The inferior vena cava is normal in size with greater than 50%  respiratory variability, suggesting right atrial pressure of 3 mmHg.   Assessment:    1. Chronic combined systolic and diastolic heart failure (HCC)   2. Coronary artery disease involving native coronary artery of native heart without angina pectoris   3. Medication management   4. Essential hypertension   5. Hyperlipidemia LDL goal <70      Plan:   In order of problems listed above:  1. Chronic Combined Systolic and Diastolic CHF - His EF was at 35-40% by echo in 2015 with similar results by repeat imaging in 10/2019.  - He denies any recent orthopnea, PND or lower extremity edema. Appears euvolemic by examination today.  - He is already on Toprol-XL 25 mg daily and Losartan 50 mg daily. Will add Spironolactone 12.5mg  daily to his medication regimen and obtain a repeat BMET in 2  weeks. Pending review of K+ levels, may be able to stop K+ supplementation. Would anticipate ordering a repeat echo in several months for reassessment of his EF. If EF remains reduced, would plan to switch Losartan to Entresto if BP allows.   2. CAD - He is s/p NTEMI in 2015 with cath showing occluded 3rd diagonal with medical management recommended. He remains active at baseline and denies any recent anginal symptoms.  - Continue ASA 81mg  daily and Toprol-XL 25mg  daily. Will request a copy of most recent FLP from his PCP as he is not currently on statin therapy.   3. HTN - BP is well-controlled at 120/80 during today's visit. Continue Losartan and Toprol-XL but will add Spironolactone as outlined above in the setting of his cardiomyopathy.   4. HLD - Followed by PCP. He was previously on statin therapy but says this was discontinued by his PCP in the interim as he was told his cholesterol was under good control. Will request a copy of most recent labs from his PCP. Goal LDL is less than 70 given his known CAD.    Medication Adjustments/Labs and Tests Ordered: Current medicines are reviewed at length with the patient today.  Concerns regarding medicines are outlined above.  Medication changes, Labs and Tests ordered today are listed in the Patient Instructions below. Patient Instructions  Medication Instructions:  START Spironolactone 12.5 mg daily. *If you need a refill on your cardiac medications before your next appointment, please call your pharmacy*   Lab Work: BMET in two weeks If you have labs (blood work) drawn today and your tests are completely normal, you will receive your results only by: MyChart Message (if you have MyChart) OR . A paper copy in the mail If you have any lab test that is abnormal or we need to change your treatment, we will call you to review the results.   Testing/Procedures: None Today   Follow-Up: At [facility], you and your health needs are our  priority.  As part of our continuing mission to provide you with exceptional heart care, we have created designated Provider Care Teams.  These Care Teams include  your primary Cardiologist (physician) and Advanced Practice Providers (APPs -  Physician Assistants and Nurse Practitioners) who all work together to provide you with the care you need, when you need it.  We recommend signing up for the patient portal called "MyChart".  Sign up information is provided on this After Visit Summary.  MyChart is used to connect with patients for Virtual Visits (Telemedicine).  Patients are able to view lab/test results, encounter notes, upcoming appointments, etc.  Non-urgent messages can be sent to your provider as well.   To learn more about what you can do with MyChart, go to [email or web address].    Your next appointment:   3-4 month(s)  The format for your next appointment:   In Person  Provider:   Nona Dell, MD or Randall An, PA-C   Other Instructions None Today         Signed, Ellsworth Lennox, PA-C  02/27/2020 9:21 AM    [facility] [address] [place], [place] [address] Phone: [phone] Fax: [phone]

## 2020-02-26 ENCOUNTER — Ambulatory Visit: Payer: BLUE CROSS/BLUE SHIELD | Admitting: Student

## 2020-02-26 ENCOUNTER — Encounter: Payer: Self-pay | Admitting: Student

## 2020-02-26 ENCOUNTER — Other Ambulatory Visit: Payer: Self-pay

## 2020-02-26 VITALS — BP 120/80 | HR 94 | Ht 70.0 in | Wt 171.2 lb

## 2020-02-26 DIAGNOSIS — I2510 Atherosclerotic heart disease of native coronary artery without angina pectoris: Secondary | ICD-10-CM | POA: Diagnosis not present

## 2020-02-26 DIAGNOSIS — Z79899 Other long term (current) drug therapy: Secondary | ICD-10-CM | POA: Diagnosis not present

## 2020-02-26 DIAGNOSIS — E785 Hyperlipidemia, unspecified: Secondary | ICD-10-CM

## 2020-02-26 DIAGNOSIS — I10 Essential (primary) hypertension: Secondary | ICD-10-CM | POA: Diagnosis not present

## 2020-02-26 DIAGNOSIS — I5042 Chronic combined systolic (congestive) and diastolic (congestive) heart failure: Secondary | ICD-10-CM | POA: Diagnosis not present

## 2020-02-26 MED ORDER — SPIRONOLACTONE 25 MG PO TABS: 12.5000 mg | ORAL_TABLET | Freq: Every day | ORAL | 3 refills | Status: DC

## 2020-02-26 NOTE — Patient Instructions (Signed)
Medication Instructions:  START Spironolactone 12.5 mg daily. *If you need a refill on your cardiac medications before your next appointment, please call your pharmacy*   Lab Work: BMET in two weeks If you have labs (blood work) drawn today and your tests are completely normal, you will receive your results only by: Marland Kitchen MyChart Message (if you have MyChart) OR . A paper copy in the mail If you have any lab test that is abnormal or we need to change your treatment, we will call you to review the results.   Testing/Procedures: None Today   Follow-Up: At [facility], you and your health needs are our priority.  As part of our continuing mission to provide you with exceptional heart care, we have created designated Provider Care Teams.  These Care Teams include your primary Cardiologist (physician) and Advanced Practice Providers (APPs -  Physician Assistants and Nurse Practitioners) who all work together to provide you with the care you need, when you need it.  We recommend signing up for the patient portal called "MyChart".  Sign up information is provided on this After Visit Summary.  MyChart is used to connect with patients for Virtual Visits (Telemedicine).  Patients are able to view lab/test results, encounter notes, upcoming appointments, etc.  Non-urgent messages can be sent to your provider as well.   To learn more about what you can do with MyChart, go to [email or web address].    Your next appointment:   3-4 month(s)  The format for your next appointment:   In Person  Provider:   Nona Dell, MD or Randall An, PA-C   Other Instructions None Today

## 2020-02-27 ENCOUNTER — Encounter: Payer: Self-pay | Admitting: Student

## 2020-03-15 ENCOUNTER — Other Ambulatory Visit (HOSPITAL_COMMUNITY)
Admission: RE | Admit: 2020-03-15 | Discharge: 2020-03-15 | Disposition: A | Discharge status: Home / Self Care | Payer: BLUE CROSS/BLUE SHIELD | Source: Ambulatory Visit | Attending: Student | Admitting: Student

## 2020-03-15 ENCOUNTER — Other Ambulatory Visit: Payer: Self-pay

## 2020-03-15 DIAGNOSIS — Z79899 Other long term (current) drug therapy: Secondary | ICD-10-CM | POA: Diagnosis not present

## 2020-03-15 LAB — BASIC METABOLIC PANEL
Anion gap: 8 (ref 5–15)
BUN: 14 mg/dL (ref 8–23)
CO2: 28 mmol/L (ref 22–32)
Calcium: 9.6 mg/dL (ref 8.9–10.3)
Chloride: 101 mmol/L (ref 98–111)
Creatinine, Ser: 1.27 mg/dL — ABNORMAL HIGH (ref 0.61–1.24)
GFR, Estimated: >60 mL/min (ref >60)
Glucose, Bld: 101 mg/dL — ABNORMAL HIGH (ref 70–99)
Potassium: 4.7 mmol/L (ref 3.5–5.1)
Sodium: 137 mmol/L (ref 135–145)

## 2020-03-16 ENCOUNTER — Telehealth: Payer: Self-pay

## 2020-03-16 DIAGNOSIS — Z79899 Other long term (current) drug therapy: Secondary | ICD-10-CM

## 2020-03-16 MED ORDER — SPIRONOLACTONE 25 MG PO TABS: 25.0000 mg | ORAL_TABLET | Freq: Every day | ORAL | 3 refills | Status: DC

## 2020-03-16 NOTE — Telephone Encounter (Signed)
-----   Message from Ellsworth Lennox, [place] sent at 03/15/2020  6:34 PM EST ----- Please let the patient know his kidney function has actually improved when compared to prior labs and K+ is within a normal range. Would recommend he go ahead and increase his Spironolactone to 25mg  daily and would stop K-dur for now given Spironolactone helps to keep K+ stable as well. Recheck BMET again in 3-4 weeks for reassessment following those changes.

## 2020-03-16 NOTE — Telephone Encounter (Signed)
Spoke with patient, he verbalized understanding about increasing the spirolactone to 25 mg daily and stopping the K-Dur. BMET in 3-4 weeks.Marland Kitchen

## 2020-05-25 NOTE — Progress Notes (Deleted)
Cardiology Office Note    Date:  05/25/2020   ID:  Manuel Hess, DOB Jul 25, 1955, MRN [id number]  PCP:  Elfredia Nevins, MD  Cardiologist: Nona Dell, MD    No chief complaint on file.   History of Present Illness:    Manuel Hess is a 65 y.o. male with past medical history of CAD (s/p NTEMI in 2015 with cath showing occluded 3rd diagonal with medical management recommended), chronic combined systolic and diastolic CHF (EF 10-93% by echo in 2015 and 10/2019), HTN and HLD who presents to the office today for 38-month follow-up.   He was last examined by myself in 02/2020 and reported overall doing well at that time, denying any recent anginal symptoms. He was already on Toprol-XL 25 mg daily along with Losartan 50 mg daily for his cardiomyopathy. He was started on Spironolactone 12.5 mg daily with anticipation of obtaining a repeat echocardiogram in several months and if EF remained reduced, would plan to switch Losartan to Entresto if BP allowed. Follow-up labs showed stable renal function and K+ was at 4.7. Therefore, it was recommended he increase Spironolactone to 25 mg daily and stop K-dur with plans for a repeat BMET in several weeks (not yet obtained).   - Echo - BMET    Past Medical History:  Diagnosis Date  . CHF (congestive heart failure) (HCC)    a. EF was at 35-40% by echo in 2015 b. similar results by repeat imaging in 10/2019  . Coronary artery disease    Non-ST elevation myocardial infarction in March of 2015. Cardiac catheterization showed an occluded third diagonal (small-diameter with left to left collaterals), 50% mid LCX and EF of 40%.   . Essential hypertension   . Hyperlipidemia   . MI (myocardial infarction) [facility])     Past Surgical History:  Procedure Laterality Date  . CARDIAC CATHETERIZATION  05/2013   [facility]  . HERNIA REPAIR      Current Medications: Outpatient Medications Prior to Visit  Medication Sig Dispense Refill  . aspirin 81 MG tablet  Take 1 tablet (81 mg total) by mouth daily. 30 tablet 1  . losartan (COZAAR) 50 MG tablet Take 1 tablet (50 mg total) by mouth daily. 90 tablet 3  . metoprolol succinate (TOPROL XL) 25 MG 24 hr tablet Take 1 tablet (25 mg total) by mouth daily. 90 tablet 3  . spironolactone (ALDACTONE) 25 MG tablet Take 1 tablet (25 mg total) by mouth daily. 90 tablet 3   No facility-administered medications prior to visit.     Allergies:   Patient has no known allergies.   Social History   Socioeconomic History  . Marital status: Single    Spouse name: Not on file  . Number of children: Not on file  . Years of education: Not on file  . Highest education level: Not on file  Occupational History  . Not on file  Tobacco Use  . Smoking status: Former Smoker    Packs/day: 1.00    Years: 20.00    Pack years: 20.00    Types: Cigarettes  . Smokeless tobacco: Never Used  Vaping Use  . Vaping Use: Never used  Substance and Sexual Activity  . Alcohol use: Not Currently    Comment: socially  . Drug use: No    Types: Marijuana    Comment: past  . Sexual activity: Not on file  Other Topics Concern  . Not on file  Social History Narrative  . Not  on file   Social Determinants of Health   Financial Resource Strain: Not on file  Food Insecurity: Not on file  Transportation Needs: Not on file  Physical Activity: Not on file  Stress: Not on file  Social Connections: Not on file     Family History:  The patient's ***family history includes Heart attack (age of onset: 2) in his brother.   Review of Systems:   Please see the history of present illness.     General:  No chills, fever, night sweats or weight changes.  Cardiovascular:  No chest pain, dyspnea on exertion, edema, orthopnea, palpitations, paroxysmal nocturnal dyspnea. Dermatological: No rash, lesions/masses Respiratory: No cough, dyspnea Urologic: No hematuria, dysuria Abdominal:   No nausea, vomiting, diarrhea, bright red blood per  rectum, melena, or hematemesis Neurologic:  No visual changes, wkns, changes in mental status. All other systems reviewed and are otherwise negative except as noted above.   Physical Exam:    VS:  There were no vitals taken for this visit.   General: Well developed, well nourished,male appearing in no acute distress. Head: Normocephalic, atraumatic. Neck: No carotid bruits. JVD not elevated.  Lungs: Respirations regular and unlabored, without wheezes or rales.  Heart: ***Regular rate and rhythm. No S3 or S4.  No murmur, no rubs, or gallops appreciated. Abdomen: Appears non-distended. No obvious abdominal masses. Msk:  Strength and tone appear normal for age. No obvious joint deformities or effusions. Extremities: No clubbing or cyanosis. No edema.  Distal pedal pulses are 2+ bilaterally. Neuro: Alert and oriented X 3. Moves all extremities spontaneously. No focal deficits noted. Psych:  Responds to questions appropriately with a normal affect. Skin: No rashes or lesions noted  Wt Readings from Last 3 Encounters:  02/26/20 171 lb 3.2 oz (77.7 kg)  11/27/19 169 lb (76.7 kg)  10/20/19 172 lb 9.6 oz (78.3 kg)        Studies/Labs Reviewed:   EKG:  EKG is*** ordered today.  The ekg ordered today demonstrates ***  Recent Labs: 03/15/2020: BUN 14; Creatinine, Ser 1.27; Potassium 4.7; Sodium 137   Lipid Panel    Component Value Date/Time   CHOL 143 07/06/2013 0848   CHOL 148 06/01/2013 0319   TRIG 99 07/06/2013 0848   TRIG 76 06/01/2013 0319   HDL 38 (L) 07/06/2013 0848   HDL 37 (L) 06/01/2013 0319   CHOLHDL 3.8 07/06/2013 0848   VLDL 15 06/01/2013 0319   LDLCALC 85 07/06/2013 0848   LDLCALC 96 06/01/2013 0319    Additional studies/ records that were reviewed today include:   Echocardiogram: 10/2019 IMPRESSIONS    1. Left ventricular ejection fraction, by estimation, is 35 to 40%. The  left ventricle has moderately decreased function. The left ventricle   demonstrates global hypokinesis. Left ventricular diastolic parameters are  consistent with Grade I diastolic  dysfunction (impaired relaxation).  2. Right ventricular systolic function is normal. The right ventricular  size is mildly enlarged. There is normal pulmonary artery systolic  pressure.  3. The mitral valve is normal in structure. No evidence of mitral valve  regurgitation. No evidence of mitral stenosis.  4. The aortic valve is tricuspid. Aortic valve regurgitation is not  visualized. No aortic stenosis is present.  5. Aortic dilatation noted. 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  6. The inferior vena cava is normal in size with greater than 50%  respiratory variability, suggesting right atrial pressure of 3 mmHg.   Assessment:  No diagnosis found.   Plan:   In order of problems listed above:  1. ***    Shared Decision Making/Informed Consent:   {Are you ordering a CV Procedure (e.g. stress test, cath, DCCV, TEE, etc)?   Press F2        :[id number]}    Medication Adjustments/Labs and Tests Ordered: Current medicines are reviewed at length with the patient today.  Concerns regarding medicines are outlined above.  Medication changes, Labs and Tests ordered today are listed in the Patient Instructions below. There are no Patient Instructions on file for this visit.   Signed, Ellsworth Lennox, PA-C  05/25/2020 12:44 PM    [facility] [address] [place], [place] [address] Phone: [phone] Fax: [phone]

## 2020-05-26 ENCOUNTER — Ambulatory Visit: Payer: BLUE CROSS/BLUE SHIELD | Admitting: Student

## 2020-06-21 NOTE — Progress Notes (Signed)
Cardiology Office Note    Date:  06/22/2020   ID:  Manuel Hess, DOB April 21, 1955, MRN [id number]  PCP:  Elfredia Nevins, MD  Cardiologist: Nona Dell, MD    Chief Complaint  Patient presents with  . Follow-up    3 month visit    History of Present Illness:    Manuel Hess is a 65 y.o. male with past medical history of CAD (s/p NSTEMI in 2015 with cath showing occluded 3rd diagonal with medical management recommended), chronic combined systolic and diastolic CHF (EF 80-88% by echo in 2015 and 10/2019), HTN and HLD who presents to the office today for 39-month follow-up.   He was last examined by myself in 02/2020 and reported overall doing well at that time, denying any recent anginal symptoms. He was already on Toprol-XL 25 mg daily along with Losartan 50 mg daily for his cardiomyopathy. He was started on Spironolactone 12.5 mg daily with anticipation of obtaining a repeat echocardiogram in several months and if EF remained reduced, would plan to switch Losartan to Entresto if BP allowed. Follow-up labs showed stable renal function and K+ was at 4.7. Therefore, it was recommended he increase Spironolactone to 25 mg daily and stop K-dur.  In talking with the patient today, he reports overall doing well since his last office visit.  He remains active at his job and is also active in doing chores around his home and denies any chest pain or dyspnea on exertion with these activities. No recent orthopnea, PND or lower extremity edema. He reports good compliance with his current medication regimen and denies any noted side effects. No lightheadedness, dizziness or presyncope.   Past Medical History:  Diagnosis Date  . CHF (congestive heart failure) (HCC)    a. EF was at 35-40% by echo in 2015 b. similar results by repeat imaging in 10/2019  . Coronary artery disease    Non-ST elevation myocardial infarction in March of 2015. Cardiac catheterization showed an occluded third diagonal  (small-diameter with left to left collaterals), 50% mid LCX and EF of 40%.   . Essential hypertension   . Hyperlipidemia   . MI (myocardial infarction) [facility])     Past Surgical History:  Procedure Laterality Date  . CARDIAC CATHETERIZATION  05/2013   [facility]  . HERNIA REPAIR      Current Medications: Outpatient Medications Prior to Visit  Medication Sig Dispense Refill  . aspirin 81 MG tablet Take 1 tablet (81 mg total) by mouth daily. 30 tablet 1  . losartan (COZAAR) 50 MG tablet Take 1 tablet (50 mg total) by mouth daily. 90 tablet 3  . metoprolol succinate (TOPROL XL) 25 MG 24 hr tablet Take 1 tablet (25 mg total) by mouth daily. 90 tablet 3  . spironolactone (ALDACTONE) 25 MG tablet Take 1 tablet (25 mg total) by mouth daily. 90 tablet 3   No facility-administered medications prior to visit.     Allergies:   Patient has no known allergies.   Social History   Socioeconomic History  . Marital status: Single    Spouse name: Not on file  . Number of children: Not on file  . Years of education: Not on file  . Highest education level: Not on file  Occupational History  . Not on file  Tobacco Use  . Smoking status: Former Smoker    Packs/day: 1.00    Years: 20.00    Pack years: 20.00    Types: Cigarettes  . Smokeless  tobacco: Never Used  Vaping Use  . Vaping Use: Never used  Substance and Sexual Activity  . Alcohol use: Not Currently    Comment: socially  . Drug use: No    Types: Marijuana    Comment: past  . Sexual activity: Not on file  Other Topics Concern  . Not on file  Social History Narrative  . Not on file   Social Determinants of Health   Financial Resource Strain: Not on file  Food Insecurity: Not on file  Transportation Needs: Not on file  Physical Activity: Not on file  Stress: Not on file  Social Connections: Not on file     Family History:  The patient's family history includes Heart attack (age of onset: 59) in his brother.   Review of  Systems:   Please see the history of present illness.     General:  No chills, fever, night sweats or weight changes.  Cardiovascular:  No chest pain, dyspnea on exertion, edema, orthopnea, palpitations, paroxysmal nocturnal dyspnea. Dermatological: No rash, lesions/masses Respiratory: No cough, dyspnea Urologic: No hematuria, dysuria Abdominal:   No nausea, vomiting, diarrhea, bright red blood per rectum, melena, or hematemesis Neurologic:  No visual changes, wkns, changes in mental status. All other systems reviewed and are otherwise negative except as noted above.   Physical Exam:    VS:  BP 110/82   Pulse 92   Ht 5\' 10"  (1.778 m)   Wt 171 lb 9.6 oz (77.8 kg)   SpO2 93%   BMI 24.62 kg/m    General: Well developed, well nourished,male appearing in no acute distress. Head: Normocephalic, atraumatic. Neck: No carotid bruits. JVD not elevated.  Lungs: Respirations regular and unlabored, without wheezes or rales.  Heart: Regular rate and rhythm. No S3 or S4.  No murmur, no rubs, or gallops appreciated. Abdomen: Appears non-distended. No obvious abdominal masses. Msk:  Strength and tone appear normal for age. No obvious joint deformities or effusions. Extremities: No clubbing or cyanosis. No lower extremity edema.  Distal pedal pulses are 2+ bilaterally. Neuro: Alert and oriented X 3. Moves all extremities spontaneously. No focal deficits noted. Psych:  Responds to questions appropriately with a normal affect. Skin: No rashes or lesions noted  Wt Readings from Last 3 Encounters:  06/22/20 171 lb 9.6 oz (77.8 kg)  02/26/20 171 lb 3.2 oz (77.7 kg)  11/27/19 169 lb (76.7 kg)     Studies/Labs Reviewed:   EKG:  EKG is not ordered today.    Recent Labs: 03/15/2020: BUN 14; Creatinine, Ser 1.27; Potassium 4.7; Sodium 137   Lipid Panel    Component Value Date/Time   CHOL 143 07/06/2013 0848   CHOL 148 06/01/2013 0319   TRIG 99 07/06/2013 0848   TRIG 76 06/01/2013 0319   HDL  38 (L) 07/06/2013 0848   HDL 37 (L) 06/01/2013 0319   CHOLHDL 3.8 07/06/2013 0848   VLDL 15 06/01/2013 0319   LDLCALC 85 07/06/2013 0848   LDLCALC 96 06/01/2013 0319    Additional studies/ records that were reviewed today include:   Echocardiogram: 10/2019 IMPRESSIONS    1. Left ventricular ejection fraction, by estimation, is 35 to 40%. The  left ventricle has moderately decreased function. The left ventricle  demonstrates global hypokinesis. Left ventricular diastolic parameters are  consistent with Grade I diastolic  dysfunction (impaired relaxation).  2. Right ventricular systolic function is normal. The right ventricular  size is mildly enlarged. There is normal pulmonary artery systolic  pressure.  3. The mitral valve is normal in structure. No evidence of mitral valve  regurgitation. No evidence of mitral stenosis.  4. The aortic valve is tricuspid. Aortic valve regurgitation is not  visualized. No aortic stenosis is present.  5. Aortic dilatation noted. 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  6. The inferior vena cava is normal in size with greater than 50%  respiratory variability, suggesting right atrial pressure of 3 mmHg.   Assessment:    1. Chronic combined systolic and diastolic heart failure (HCC)   2. Coronary artery disease involving native coronary artery of native heart without angina pectoris   3. Essential hypertension      Plan:   In order of problems listed above:  1. Chronic Combined Systolic and Diastolic CHF - His EF was at 35-40% by echo in 2015 with similar results by echo in 10/2019 but had not followed-up in the interim. He reports overall doing well and appears euvolemic on examination today. - He is currently on Toprol-XL 25 mg daily, Losartan 50 mg daily and Spironolactone 25 mg daily. I am unable to further titrate his current medications given his BP of 110/82. I did recommend that we obtain a follow-up  limited echocardiogram for reassessment of his EF. If this remains reduced, we discussed switching Losartan to [facility] with plans for a repeat BMET in 2 weeks following this. Would also consider the addition of SGLT2 inhibitor therapy in the future if EF remains reduced.  2. CAD - He is s/p NSTEMI in 2015 with cath showing occluded 3rd diagonal with medical management recommended. He denies any recent chest pain or dyspnea on exertion. Continue ASA and beta-blocker therapy. He reports he has not been on a statin per his PCP given his well-controlled cholesterol. Labs from PCP previously requested.    3. HTN - BP is well controlled at 110/82 during today's visit.  Continue current medication regimen for now as outlined above.    Medication Adjustments/Labs and Tests Ordered: Current medicines are reviewed at length with the patient today.  Concerns regarding medicines are outlined above.  Medication changes, Labs and Tests ordered today are listed in the Patient Instructions below. Patient Instructions  Medication Instructions:  Your physician recommends that you continue on your current medications as directed. Please refer to the Current Medication list given to you today.  *If you need a refill on your cardiac medications before your next appointment, please call your pharmacy*   Lab Work: NONE   If you have labs (blood work) drawn today and your tests are completely normal, you will receive your results only by: Marland Kitchen MyChart Message (if you have MyChart) OR . A paper copy in the mail If you have any lab test that is abnormal or we need to change your treatment, we will call you to review the results.   Testing/Procedures: Your physician has requested that you have an echocardiogram. Echocardiography is a painless test that uses sound waves to create images of your heart. It provides your doctor with information about the size and shape of your heart and how well your heart's chambers  and valves are working. This procedure takes approximately one hour. There are no restrictions for this procedure.  Follow-Up: At [facility], you and your health needs are our priority.  As part of our continuing mission to provide you with exceptional heart care, we have created designated Provider Care Teams.  These Care Teams include your primary Cardiologist (  physician) and Advanced Practice Providers (APPs -  Physician Assistants and Nurse Practitioners) who all work together to provide you with the care you need, when you need it.  We recommend signing up for the patient portal called "MyChart".  Sign up information is provided on this After Visit Summary.  MyChart is used to connect with patients for Virtual Visits (Telemedicine).  Patients are able to view lab/test results, encounter notes, upcoming appointments, etc.  Non-urgent messages can be sent to your provider as well.   To learn more about what you can do with MyChart, go to [email or web address].    Your next appointment:   3 month(s)  The format for your next appointment:   In Person  Provider:   Nona Dell, MD or Randall An, PA-C   Other Instructions Thank you for choosing [facility]!       Signed, Ellsworth Lennox, PA-C  06/22/2020 4:59 PM    [facility] [address] [place], [place] [address] Phone: [phone] Fax: [phone]

## 2020-06-22 ENCOUNTER — Encounter: Payer: Self-pay | Admitting: Student

## 2020-06-22 ENCOUNTER — Ambulatory Visit (INDEPENDENT_AMBULATORY_CARE_PROVIDER_SITE_OTHER): Payer: BLUE CROSS/BLUE SHIELD | Admitting: Student

## 2020-06-22 ENCOUNTER — Other Ambulatory Visit: Payer: Self-pay

## 2020-06-22 VITALS — BP 110/82 | HR 92 | Ht 70.0 in | Wt 171.6 lb

## 2020-06-22 DIAGNOSIS — I10 Essential (primary) hypertension: Secondary | ICD-10-CM | POA: Diagnosis not present

## 2020-06-22 DIAGNOSIS — I5042 Chronic combined systolic (congestive) and diastolic (congestive) heart failure: Secondary | ICD-10-CM

## 2020-06-22 DIAGNOSIS — I2510 Atherosclerotic heart disease of native coronary artery without angina pectoris: Secondary | ICD-10-CM

## 2020-06-22 NOTE — Patient Instructions (Signed)
Medication Instructions:  Your physician recommends that you continue on your current medications as directed. Please refer to the Current Medication list given to you today.  *If you need a refill on your cardiac medications before your next appointment, please call your pharmacy*   Lab Work: NONE   If you have labs (blood work) drawn today and your tests are completely normal, you will receive your results only by: MyChart Message (if you have MyChart) OR A paper copy in the mail If you have any lab test that is abnormal or we need to change your treatment, we will call you to review the results.   Testing/Procedures: Your physician has requested that you have an echocardiogram. Echocardiography is a painless test that uses sound waves to create images of your heart. It provides your doctor with information about the size and shape of your heart and how well your heart's chambers and valves are working. This procedure takes approximately one hour. There are no restrictions for this procedure.    Follow-Up: At [facility], you and your health needs are our priority.  As part of our continuing mission to provide you with exceptional heart care, we have created designated Provider Care Teams.  These Care Teams include your primary Cardiologist (physician) and Advanced Practice Providers (APPs -  Physician Assistants and Nurse Practitioners) who all work together to provide you with the care you need, when you need it.  We recommend signing up for the patient portal called "MyChart".  Sign up information is provided on this After Visit Summary.  MyChart is used to connect with patients for Virtual Visits (Telemedicine).  Patients are able to view lab/test results, encounter notes, upcoming appointments, etc.  Non-urgent messages can be sent to your provider as well.   To learn more about what you can do with MyChart, go to [email or web address].    Your next appointment:   3  month(s)  The format for your next appointment:   In Person  Provider:   Samuel McDowell, MD or Brittany Strader, PA-C   Other Instructions Thank you for choosing [facility]!    

## 2020-07-19 ENCOUNTER — Ambulatory Visit (HOSPITAL_COMMUNITY): Admission: RE | Admit: 2020-07-19 | Payer: BLUE CROSS/BLUE SHIELD | Source: Ambulatory Visit

## 2020-08-09 ENCOUNTER — Other Ambulatory Visit: Payer: Self-pay | Admitting: Student

## 2020-09-20 NOTE — Progress Notes (Signed)
Cardiology Office Note    Date:  09/21/2020   ID:  Manuel Hess, DOB 1956-02-12, MRN [id number]  PCP:  Elfredia Nevins, MD  Cardiologist: Nona Dell, MD    Chief Complaint  Patient presents with   Follow-up    3 month visit    History of Present Illness:    Manuel Hess is a 65 y.o. male with past medical history of CAD (s/p NSTEMI in 2015 with cath showing occluded 3rd diagonal with medical management recommended), HFrEF (EF 35-40% by echo in 2015 and 10/2019), HTN and HLD who presents to the office today for 69-month follow-up.  He was examined by myself in 06/2020 and reported being active at baseline and denied any recent anginal symptoms. BP was at 110/82 during his visit, therefore he was continued on his current regimen with Toprol-XL 25 mg daily, Losartan 50 mg daily and Spironolactone 25 mg daily. It was recommended that he have a follow-up limited echocardiogram for reassessment of his EF and if this remained reduced, could try switching Losartan to Entresto if BP allowed and could also consider the addition of SGLT2 inhibitor therapy. He was scheduled for a limited echocardiogram on 07/19/2020 but no showed for the test.   In talking with the patient today, he reports overall doing well since his last office visit. He does experience intermittent fatigue but denies any recent chest pain or dyspnea on exertion. No recent orthopnea, PND or pitting edema. He did not have his echocardiogram as he reports being unaware it was scheduled for that day.  Past Medical History:  Diagnosis Date   CHF (congestive heart failure) (HCC)    a. EF was at 35-40% by echo in 2015 b. similar results by repeat imaging in 10/2019   Coronary artery disease    Non-ST elevation myocardial infarction in March of 2015. Cardiac catheterization showed an occluded third diagonal (small-diameter with left to left collaterals), 50% mid LCX and EF of 40%.    Essential hypertension    Hyperlipidemia    MI  (myocardial infarction) [facility])     Past Surgical History:  Procedure Laterality Date   CARDIAC CATHETERIZATION  05/2013   [facility]   HERNIA REPAIR      Current Medications: Outpatient Medications Prior to Visit  Medication Sig Dispense Refill   aspirin 81 MG tablet Take 1 tablet (81 mg total) by mouth daily. 30 tablet 1   metoprolol succinate (TOPROL-XL) 25 MG 24 hr tablet TAKE 1 TABLET BY MOUTH ONCE DAILY. 90 tablet 1   spironolactone (ALDACTONE) 25 MG tablet Take 1 tablet (25 mg total) by mouth daily. 90 tablet 3   losartan (COZAAR) 50 MG tablet Take 1 tablet (50 mg total) by mouth daily. 90 tablet 3   No facility-administered medications prior to visit.     Allergies:   Patient has no known allergies.   Social History   Socioeconomic History   Marital status: Single    Spouse name: Not on file   Number of children: Not on file   Years of education: Not on file   Highest education level: Not on file  Occupational History   Not on file  Tobacco Use   Smoking status: Former    Packs/day: 1.00    Years: 20.00    Pack years: 20.00    Types: Cigarettes   Smokeless tobacco: Never  Vaping Use   Vaping Use: Never used  Substance and Sexual Activity   Alcohol use: Not Currently  Comment: socially   Drug use: No    Types: Marijuana    Comment: past   Sexual activity: Not on file  Other Topics Concern   Not on file  Social History Narrative   Not on file   Social Determinants of Health   Financial Resource Strain: Not on file  Food Insecurity: Not on file  Transportation Needs: Not on file  Physical Activity: Not on file  Stress: Not on file  Social Connections: Not on file     Family History:  The patient's family history includes Heart attack (age of onset: 49) in his brother.   Review of Systems:    Please see the history of present illness.     All other systems reviewed and are otherwise negative except as noted above.   Physical Exam:    VS:  BP  128/76   Pulse 72   Ht 5\' 10"  (1.778 m)   Wt 172 lb 12.8 oz (78.4 kg)   SpO2 98%   BMI 24.79 kg/m    General: Well developed, well nourished,male appearing in no acute distress. Head: Normocephalic, atraumatic. Neck: No carotid bruits. JVD not elevated.  Lungs: Respirations regular and unlabored, without wheezes or rales.  Heart: Regular rate and rhythm. No S3 or S4.  No murmur, no rubs, or gallops appreciated. Abdomen: Appears non-distended. No obvious abdominal masses. Msk:  Strength and tone appear normal for age. No obvious joint deformities or effusions. Extremities: No clubbing or cyanosis. No pitting edema.  Distal pedal pulses are 2+ bilaterally. Neuro: Alert and oriented X 3. Moves all extremities spontaneously. No focal deficits noted. Psych:  Responds to questions appropriately with a normal affect. Skin: No rashes or lesions noted  Wt Readings from Last 3 Encounters:  09/21/20 172 lb 12.8 oz (78.4 kg)  06/22/20 171 lb 9.6 oz (77.8 kg)  02/26/20 171 lb 3.2 oz (77.7 kg)     Studies/Labs Reviewed:   EKG:  EKG is not ordered today.   Recent Labs: 03/15/2020: BUN 14; Creatinine, Ser 1.27; Potassium 4.7; Sodium 137   Lipid Panel    Component Value Date/Time   CHOL 143 07/06/2013 0848   CHOL 148 06/01/2013 0319   TRIG 99 07/06/2013 0848   TRIG 76 06/01/2013 0319   HDL 38 (L) 07/06/2013 0848   HDL 37 (L) 06/01/2013 0319   CHOLHDL 3.8 07/06/2013 0848   VLDL 15 06/01/2013 0319   LDLCALC 85 07/06/2013 0848   LDLCALC 96 06/01/2013 0319    Additional studies/ records that were reviewed today include:   Echocardiogram: 10/2019 IMPRESSIONS     1. Left ventricular ejection fraction, by estimation, is 35 to 40%. The  left ventricle has moderately decreased function. The left ventricle  demonstrates global hypokinesis. Left ventricular diastolic parameters are  consistent with Grade I diastolic  dysfunction (impaired relaxation).   2. Right ventricular systolic  function is normal. The right ventricular  size is mildly enlarged. There is normal pulmonary artery systolic  pressure.   3. The mitral valve is normal in structure. No evidence of mitral valve  regurgitation. No evidence of mitral stenosis.   4. The aortic valve is tricuspid. Aortic valve regurgitation is not  visualized. No aortic stenosis is present.   5. Aortic dilatation noted. 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   6. The inferior vena cava is normal in size with greater than 50%  respiratory variability, suggesting right atrial pressure of 3 mmHg.  Assessment:    1. Chronic combined systolic and diastolic heart failure (HCC)   2. Coronary artery disease involving native coronary artery of native heart without angina pectoris   3. Hyperlipidemia LDL goal <70   4. Essential hypertension      Plan:   In order of problems listed above:  1. HFrEF - His EF was at 35-40% by echocardiogram in 2015 and in 10/2019. Was scheduled for a limited echo in the interim but says he was unaware of the appointment. - He does report fatigue but denies any changes in his respiratory status. Does not appear volume overloaded by examination today. - He is currently on Toprol-XL 25mg  daily, Losartan 50mg  daily and Spironolactone 25mg  daily. Reviewed options with the patient and will go ahead and stop Losartan 50 mg daily and transition to Entresto 24-26mg  BID. 30-day card and co-pay card provided. Repeat BMET in 2 weeks. Will plan for a repeat echocardiogram in 3 months for reassessment of his EF. If still reduced, could add SGLT2 inhibitor.   2. CAD - He is s/p NSTEMI in 2015 with cath showing occluded 3rd diagonal with medical management recommended.  - Previously on Atorvastatin 40mg  daily but says this was discontinued by his PCP in the interim to his cholesterol being well controlled. Will recheck a FLP with his upcoming labs. Continue ASA 81mg  daily and Toprol-XL 25mg   daily.   3. HTN - BP is well controlled at 128/76 during today's visit. Will continue Toprol-XL and Spironolactone at current dosing but switch Losartan to [facility] as outlined above.    Medication Adjustments/Labs and Tests Ordered: Current medicines are reviewed at length with the patient today.  Concerns regarding medicines are outlined above.  Medication changes, Labs and Tests ordered today are listed in the Patient Instructions below. Patient Instructions  Medication Instructions:   Stop Losartan. Start Entresto 24-26mg  Twice Daily.   Labwork:  BMET and Fasting Lipid Panel in 2 weeks.   Testing/Procedures: Limited Echocardiogram in 3 months for reassessment of the pumping function of your heart.   Follow-Up:  With , PA-C or Dr. 2016 in 3 months after echo is obtained.   Any Other Special Instructions Will Be Listed Below (If Applicable).     If you need a refill on your cardiac medications before your next appointment, please call your pharmacy.   Signed, , PA-C  09/21/2020 4:54 PM    [facility] [address] [place], [place] Phone: [phone] Fax: [phone]

## 2020-09-21 ENCOUNTER — Ambulatory Visit: Payer: BLUE CROSS/BLUE SHIELD | Admitting: Student

## 2020-09-21 ENCOUNTER — Encounter: Payer: Self-pay | Admitting: Student

## 2020-09-21 ENCOUNTER — Other Ambulatory Visit: Payer: Self-pay

## 2020-09-21 VITALS — BP 128/76 | HR 72 | Ht 70.0 in | Wt 172.8 lb

## 2020-09-21 DIAGNOSIS — I5042 Chronic combined systolic (congestive) and diastolic (congestive) heart failure: Secondary | ICD-10-CM

## 2020-09-21 DIAGNOSIS — I10 Essential (primary) hypertension: Secondary | ICD-10-CM | POA: Diagnosis not present

## 2020-09-21 DIAGNOSIS — I2510 Atherosclerotic heart disease of native coronary artery without angina pectoris: Secondary | ICD-10-CM

## 2020-09-21 DIAGNOSIS — E785 Hyperlipidemia, unspecified: Secondary | ICD-10-CM | POA: Diagnosis not present

## 2020-09-21 MED ORDER — ENTRESTO 24-26 MG PO TABS: 1.0000 | ORAL_TABLET | Freq: Two times a day (BID) | ORAL | 11 refills | Status: DC

## 2020-09-21 NOTE — Patient Instructions (Signed)
Medication Instructions:   Stop Losartan. Start Entresto 24-26mg  Twice Daily.   Labwork:  BMET and Fasting Lipid Panel in 2 weeks.   Testing/Procedures: Limited Echocardiogram in 3 months for reassessment of the pumping function of your heart.   Follow-Up:  With Randall An, PA-C or Dr. Diona Browner in 3 months after echo is obtained.   Any Other Special Instructions Will Be Listed Below (If Applicable).     If you need a refill on your cardiac medications before your next appointment, please call your pharmacy.

## 2020-10-07 ENCOUNTER — Telehealth: Payer: Self-pay | Admitting: Student

## 2020-10-07 NOTE — Telephone Encounter (Signed)
   Does he have a BP cuff at home? If so, I would encourage him to try Sherryll Burger again but this time try cutting his Spironolactone in half as it might have caused his BP to drop. Would check BP if able. Would confirm he should be taking:  ASA 81mg  daily Toprol-XL 25mg  daily Spironolactone 12.5mg  daily (cutting in half as outlined above) Entresto 24-26mg  BID.   If dizziness persists after taking Entresto and cutting Spironolactone in half, let me know and we can try Losartan in place of Entresto.   Signed, , PA-C 10/07/2020, 8:01 PM Pager: [phone]

## 2020-10-07 NOTE — Telephone Encounter (Signed)
Returned call to pt. No answer. Left msg to call office.

## 2020-10-07 NOTE — Telephone Encounter (Signed)
Patient returning a call back to [facility]

## 2020-10-07 NOTE — Telephone Encounter (Signed)
Spoke with pt who states that he was not taking losartan when prescribed Entresto. Pt reports that after taking Entresto for 24 hours that he became dizzy and stopped taking the medication. Pt reports that he is no longer dizzy. Pt did not check his BP while dizzy. He would like to know what medication you would like him to try next. Please advise.

## 2020-10-07 NOTE — Telephone Encounter (Signed)
Patient called, wants to speak to [place] re: meds-Entresto  said he stopped taking because it made him dizzy. He said BS told him to stop taking Losartan  but he said he never took it. He had been taking ATORVASTATIN 40 MG prescribed by Dr Diona Browner for his cholesterol. He stopped taking it because it made him feel worse so he resumed taking. He can be reached at [phone].

## 2020-10-10 NOTE — Telephone Encounter (Signed)
Called pt. No answer, left msg to call back.  

## 2020-10-10 NOTE — Telephone Encounter (Signed)
Called pt, no answer. Left msg to call back

## 2020-10-11 ENCOUNTER — Other Ambulatory Visit (HOSPITAL_COMMUNITY)
Admission: RE | Admit: 2020-10-11 | Discharge: 2020-10-11 | Disposition: A | Discharge status: Home / Self Care | Payer: BLUE CROSS/BLUE SHIELD | Source: Ambulatory Visit | Attending: Student | Admitting: Student

## 2020-10-11 ENCOUNTER — Other Ambulatory Visit: Payer: Self-pay

## 2020-10-11 DIAGNOSIS — E785 Hyperlipidemia, unspecified: Secondary | ICD-10-CM | POA: Diagnosis present

## 2020-10-11 DIAGNOSIS — I5042 Chronic combined systolic (congestive) and diastolic (congestive) heart failure: Secondary | ICD-10-CM | POA: Diagnosis present

## 2020-10-11 LAB — BASIC METABOLIC PANEL
Anion gap: 6 (ref 5–15)
BUN: 11 mg/dL (ref 8–23)
CO2: 27 mmol/L (ref 22–32)
Calcium: 8.7 mg/dL — ABNORMAL LOW (ref 8.9–10.3)
Chloride: 104 mmol/L (ref 98–111)
Creatinine, Ser: 1.59 mg/dL — ABNORMAL HIGH (ref 0.61–1.24)
GFR, Estimated: 48 mL/min — ABNORMAL LOW (ref >60)
Glucose, Bld: 142 mg/dL — ABNORMAL HIGH (ref 70–99)
Potassium: 3.9 mmol/L (ref 3.5–5.1)
Sodium: 137 mmol/L (ref 135–145)

## 2020-10-11 LAB — LIPID PANEL
Cholesterol: 134 mg/dL (ref 0–200)
HDL: 36 mg/dL — ABNORMAL LOW (ref >40)
LDL Cholesterol: 76 mg/dL (ref 0–99)
Total CHOL/HDL Ratio: 3.7 RATIO
Triglycerides: 112 mg/dL (ref <150)
VLDL: 22 mg/dL (ref 0–40)

## 2020-10-11 MED ORDER — SPIRONOLACTONE 25 MG PO TABS: 12.5000 mg | ORAL_TABLET | Freq: Every day | ORAL | 3 refills | Status: DC

## 2020-10-11 NOTE — Telephone Encounter (Signed)
Pt walked into office and confirmed he is taking Asa, Toprol, Spironolactone, and Entresto. Pt verbalized he will cut spironolactone in half and give Entresto another try. Pt will also keep track of bp.

## 2020-10-12 ENCOUNTER — Telehealth: Payer: Self-pay | Admitting: *Deleted

## 2020-10-12 DIAGNOSIS — Z79899 Other long term (current) drug therapy: Secondary | ICD-10-CM

## 2020-10-12 NOTE — Telephone Encounter (Signed)
-----   Message from Ellsworth Lennox, [place] sent at 10/12/2020  7:42 AM EDT ----- Please let the patient know his cholesterol is overall well-controlled with total cholesterol at 134 and LDL at 76. We prefer for his LDL to be less than 70 given his known CAD but he previously reported not tolerating statins well. His electrolytes are within a normal range. Kidney function has slightly worsened since last year with creatinine going from 1.27 to 1.59. Could be due to dehydration. Given that he is restarting Entresto and cutting Spironolactone in half, would recommend rechecking a BMET again in 2-3 weeks to see how his kidneys respond to the medication changes.

## 2020-10-12 NOTE — Telephone Encounter (Signed)
Pt notified and order placed 

## 2020-11-08 ENCOUNTER — Telehealth: Payer: Self-pay | Admitting: Student

## 2020-11-08 MED ORDER — METOPROLOL SUCCINATE ER 25 MG PO TB24: 25.0000 mg | ORAL_TABLET | Freq: Every day | ORAL | 1 refills | Status: DC

## 2020-11-08 NOTE — Telephone Encounter (Signed)
*  STAT* If patient is at the pharmacy, call can be transferred to refill team.   1. Which medications need to be refilled? (please list name of each medication and dose if known) Metoprolol 25mg   2. Which pharmacy/location (including street and city if local pharmacy) is medication to be sent to? [facility]  3. Do they need a 30 day or 90 day supply? 90  He can be reached after 3pm [phone]

## 2020-11-08 NOTE — Telephone Encounter (Signed)
Completed.

## 2020-12-22 ENCOUNTER — Ambulatory Visit (HOSPITAL_COMMUNITY)
Admission: RE | Admit: 2020-12-22 | Discharge: 2020-12-22 | Disposition: A | Discharge status: Home / Self Care | Payer: BLUE CROSS/BLUE SHIELD | Source: Ambulatory Visit | Attending: Student | Admitting: Student

## 2020-12-22 ENCOUNTER — Other Ambulatory Visit (HOSPITAL_COMMUNITY)
Admission: RE | Admit: 2020-12-22 | Discharge: 2020-12-22 | Disposition: A | Discharge status: Home / Self Care | Payer: BLUE CROSS/BLUE SHIELD | Source: Ambulatory Visit | Attending: Student | Admitting: Student

## 2020-12-22 ENCOUNTER — Other Ambulatory Visit: Payer: Self-pay

## 2020-12-22 DIAGNOSIS — I5042 Chronic combined systolic (congestive) and diastolic (congestive) heart failure: Secondary | ICD-10-CM | POA: Insufficient documentation

## 2020-12-22 DIAGNOSIS — Z79899 Other long term (current) drug therapy: Secondary | ICD-10-CM

## 2020-12-22 LAB — BASIC METABOLIC PANEL
Anion gap: 8 (ref 5–15)
BUN: 12 mg/dL (ref 8–23)
CO2: 27 mmol/L (ref 22–32)
Calcium: 8.9 mg/dL (ref 8.9–10.3)
Chloride: 106 mmol/L (ref 98–111)
Creatinine, Ser: 1.45 mg/dL — ABNORMAL HIGH (ref 0.61–1.24)
GFR, Estimated: 54 mL/min — ABNORMAL LOW (ref >60)
Glucose, Bld: 96 mg/dL (ref 70–99)
Potassium: 4 mmol/L (ref 3.5–5.1)
Sodium: 141 mmol/L (ref 135–145)

## 2020-12-22 LAB — ECHOCARDIOGRAM LIMITED
Calc EF: 42.2 %
S' Lateral: 3.6 cm
Single Plane A2C EF: 43 %
Single Plane A4C EF: 38.1 %

## 2020-12-22 NOTE — Progress Notes (Signed)
*  PRELIMINARY RESULTS* Echocardiogram 2D Echocardiogram has been performed.  Stacey Drain 12/22/2020, 3:42 PM

## 2020-12-28 NOTE — Progress Notes (Signed)
Cardiology Office Note    Date:  12/29/2020   ID:  Manuel Hess, DOB 10/13/1955, MRN [id number]  PCP:  Elfredia Nevins, MD  Cardiologist: Nona Dell, MD    Chief Complaint  Patient presents with   Follow-up    3 month visit    History of Present Illness:    Manuel Hess is a 65 y.o. male with past medical history of CAD (s/p NSTEMI in 2015 with cath showing occluded 3rd diagonal with medical management recommended), HFrEF (EF 35-40% by echo in 2015 and 10/2019), HTN and HLD who presents to the office today for 12-month follow-up.   He was examined by myself in 09/2020 and reported having intermittent fatigue but denied any recent chest pain or dyspnea on exertion. He was continued on Toprol-XL and Spironolactone with Losartan being transitioned to Entresto 24-26 mg twice daily with plans for a repeat echocardiogram in 3 months and consideration of adding an SGLT2 inhibitor if his EF remained reduced. He did call the office in the interim reporting dizziness and it was recommended he reduce Spironolactone to 12.5 mg daily. Repeat limited echo on 12/22/2020 showed his EF remained reduced at 35%. He did have mild dilation of the aortic root at 42 mm.  In talking with the patient today, he reports overall feeling very well since his last office visit. He denies any recent chest pain or dyspnea on exertion. No recent orthopnea, PND or lower extremity edema. He reports compliance with ASA, Entresto and Spironolactone but did quit taking Toprol-XL in the interim as he thought this had been discontinued. He also previously self discontinued Atorvastatin due to feeling like this was causing discomfort in his chest.  Past Medical History:  Diagnosis Date   CHF (congestive heart failure) (HCC)    a. EF was at 35-40% by echo in 2015 b. similar results by repeat imaging in 10/2019   Coronary artery disease    Non-ST elevation myocardial infarction in March of 2015. Cardiac catheterization  showed an occluded third diagonal (small-diameter with left to left collaterals), 50% mid LCX and EF of 40%.    Essential hypertension    Hyperlipidemia    MI (myocardial infarction) [facility])     Past Surgical History:  Procedure Laterality Date   CARDIAC CATHETERIZATION  05/2013   [facility]   HERNIA REPAIR      Current Medications: Outpatient Medications Prior to Visit  Medication Sig Dispense Refill   aspirin 81 MG tablet Take 1 tablet (81 mg total) by mouth daily. 30 tablet 1   spironolactone (ALDACTONE) 25 MG tablet Take 0.5 tablets (12.5 mg total) by mouth daily. 90 tablet 3   sacubitril-valsartan (ENTRESTO) 24-26 MG Take 1 tablet by mouth 2 (two) times daily. 60 tablet 11   metoprolol succinate (TOPROL-XL) 25 MG 24 hr tablet Take 1 tablet (25 mg total) by mouth daily. (Patient not taking: Reported on 12/29/2020) 90 tablet 1   No facility-administered medications prior to visit.     Allergies:   Patient has no known allergies.   Social History   Socioeconomic History   Marital status: Single    Spouse name: Not on file   Number of children: Not on file   Years of education: Not on file   Highest education level: Not on file  Occupational History   Not on file  Tobacco Use   Smoking status: Former    Packs/day: 1.00    Years: 20.00    Pack years: 20.00  Types: Cigarettes   Smokeless tobacco: Never  Vaping Use   Vaping Use: Never used  Substance and Sexual Activity   Alcohol use: Not Currently    Comment: socially   Drug use: No    Types: Marijuana    Comment: past   Sexual activity: Not on file  Other Topics Concern   Not on file  Social History Narrative   Not on file   Social Determinants of Health   Financial Resource Strain: Not on file  Food Insecurity: Not on file  Transportation Needs: Not on file  Physical Activity: Not on file  Stress: Not on file  Social Connections: Not on file     Family History:  The patient's family history includes Heart  attack (age of onset: 51) in his brother.   Review of Systems:    Please see the history of present illness.     All other systems reviewed and are otherwise negative except as noted above.   Physical Exam:    VS:  BP 122/90   Pulse 80   Ht 5\' 10"  (1.778 m)   Wt 174 lb (78.9 kg)   SpO2 97%   BMI 24.97 kg/m    General: Well developed, well nourished,male appearing in no acute distress. Head: Normocephalic, atraumatic. Neck: No carotid bruits. JVD not elevated.  Lungs: Respirations regular and unlabored, without wheezes or rales.  Heart: Regular rate and rhythm. No S3 or S4.  No murmur, no rubs, or gallops appreciated. Abdomen: Appears non-distended. No obvious abdominal masses. Msk:  Strength and tone appear normal for age. No obvious joint deformities or effusions. Extremities: No clubbing or cyanosis. No pitting edema.  Distal pedal pulses are 2+ bilaterally. Neuro: Alert and oriented X 3. Moves all extremities spontaneously. No focal deficits noted. Psych:  Responds to questions appropriately with a normal affect. Skin: No rashes or lesions noted  Wt Readings from Last 3 Encounters:  12/29/20 174 lb (78.9 kg)  09/21/20 172 lb 12.8 oz (78.4 kg)  06/22/20 171 lb 9.6 oz (77.8 kg)      Studies/Labs Reviewed:   EKG:  EKG is not ordered today.   Recent Labs: 12/22/2020: BUN 12; Creatinine, Ser 1.45; Potassium 4.0; Sodium 141   Lipid Panel    Component Value Date/Time   CHOL 134 10/11/2020 1538   CHOL 143 07/06/2013 0848   CHOL 148 06/01/2013 0319   TRIG 112 10/11/2020 1538   TRIG 76 06/01/2013 0319   HDL 36 (L) 10/11/2020 1538   HDL 38 (L) 07/06/2013 0848   HDL 37 (L) 06/01/2013 0319   CHOLHDL 3.7 10/11/2020 1538   VLDL 22 10/11/2020 1538   VLDL 15 06/01/2013 0319   LDLCALC 76 10/11/2020 1538   LDLCALC 85 07/06/2013 0848   LDLCALC 96 06/01/2013 0319    Additional studies/ records that were reviewed today include:   Echocardiogram: 10/2019 IMPRESSIONS      1. Left ventricular ejection fraction, by estimation, is 35 to 40%. The  left ventricle has moderately decreased function. The left ventricle  demonstrates global hypokinesis. Left ventricular diastolic parameters are  consistent with Grade I diastolic  dysfunction (impaired relaxation).   2. Right ventricular systolic function is normal. The right ventricular  size is mildly enlarged. There is normal pulmonary artery systolic  pressure.   3. The mitral valve is normal in structure. No evidence of mitral valve  regurgitation. No evidence of mitral stenosis.   4. The aortic valve is tricuspid. Aortic valve regurgitation  is not  visualized. No aortic stenosis is present.   5. Aortic dilatation noted. 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   6. The inferior vena cava is normal in size with greater than 50%  respiratory variability, suggesting right atrial pressure of 3 mmHg.   Limited Echo: 12/2020 IMPRESSIONS     1. Limited study.   2. Left ventricular ejection fraction, by estimation, is approximately  35%. The left ventricle has moderately decreased function. The left  ventricle demonstrates regional wall motion abnormalities (see scoring  diagram/findings for description).   3. The mitral valve is abnormal.   4. Aortic dilatation noted. There is mild dilatation of the aortic root,  measuring 42 mm.   5. The inferior vena cava is normal in size with greater than 50%  respiratory variability, suggesting right atrial pressure of 3 mmHg.   Comparison(s): Prior images reviewed side by side. No significant change  in LVEF.   Assessment:    1. Chronic combined systolic and diastolic heart failure (HCC)   2. Nonischemic cardiomyopathy (HCC)   3. Coronary artery disease involving native coronary artery of native heart without angina pectoris   4. Essential hypertension   5. Stage 3a chronic kidney disease (HCC)   6. Aortic root dilation (HCC)       Plan:   In order of problems listed above:  1. HFrEF/Presumed NICM - His EF remains reduced at 35% by most recent imaging and overall similar to prior results from 2015. He denies any recent respiratory issues and appears euvolemic on examination today. Given his persistent cardiomyopathy and the time-frame since his last ischemic evaluation, will plan for a Lexiscan Myoview. Also reviewed possible catheterization as I had previously discussed this with Dr. Diona Browner but the patient wishes to hold off on a cath unless his stress test is abnormal which certainly seems reasonable given his asymptomatic state.  - Continue Entresto 24-26mg  BID and Spironolactone 12.5mg  daily. I have asked him to restart Toprol-XL 25mg  daily as he previously discontinued this. Would plan to add an SGLT2 inhibitor next but will make one adjustment at a time given his side effects with medication titration in the past.   2. CAD - He is s/p NSTEMI in 2015 with cath showing occluded 3rd diagonal with medical management recommended. Will plan for a NST as outlined above.  - Continue ASA 81mg  daily. He previously was on Atorvastatin but self-discontinued due to side effects. LDL was at 76 in 09/2020. Can consider re-challenging with lose-dose Crestor in the future but will make one medication change at a time as outlined above.   3. HTN - His BP is at 122/90 during today's visit. Continue Entresto and Spironolactone with plans to restart Toprol-XL 25mg  daily.   4. Stage 3 CKD - His creatinine was at 1.45 in 12/2020 which is close to his baseline.   5. Aortic Root Dilation - His aortic root measured at 42 mm by recent echocardiogram earlier this month. Would plan for repeat imaging next year.   Medication Adjustments/Labs and Tests Ordered: Current medicines are reviewed at length with the patient today.  Concerns regarding medicines are outlined above.  Medication changes, Labs and Tests ordered today are listed  in the Patient Instructions below. Patient Instructions  Medication Instructions:   Restart Toprol XL 25 mg Daily   *If you need a refill on your cardiac medications before your next appointment, please call your pharmacy*   Lab Work: NONE  If you have labs (blood work) drawn today and your tests are completely normal, you will receive your results only by: MyChart Message (if you have MyChart) OR A paper copy in the mail If you have any lab test that is abnormal or we need to change your treatment, we will call you to review the results.   Testing/Procedures: Your physician has requested that you have a lexiscan myoview. For further information please visit [email or web address] Please follow instruction sheet, as given.    Follow-Up: At [facility], you and your health needs are our priority.  As part of our continuing mission to provide you with exceptional heart care, we have created designated Provider Care Teams.  These Care Teams include your primary Cardiologist (physician) and Advanced Practice Providers (APPs -  Physician Assistants and Nurse Practitioners) who all work together to provide you with the care you need, when you need it.  We recommend signing up for the patient portal called "MyChart".  Sign up information is provided on this After Visit Summary.  MyChart is used to connect with patients for Virtual Visits (Telemedicine).  Patients are able to view lab/test results, encounter notes, upcoming appointments, etc.  Non-urgent messages can be sent to your provider as well.   To learn more about what you can do with MyChart, go to [email or web address].    Your next appointment:   3 month(s)  The format for your next appointment:   In Person  Provider:   Nona Dell, MD or Randall An, PA-C   Other Instructions Thank you for choosing [facility]!     Signed, Ellsworth Lennox, PA-C  12/29/2020 7:58 PM    [facility] [address] [place], [place] [address] Phone: [phone] Fax: [phone]

## 2020-12-29 ENCOUNTER — Ambulatory Visit: Payer: BLUE CROSS/BLUE SHIELD | Admitting: Student

## 2020-12-29 ENCOUNTER — Encounter: Payer: Self-pay | Admitting: Student

## 2020-12-29 ENCOUNTER — Other Ambulatory Visit: Payer: Self-pay

## 2020-12-29 ENCOUNTER — Encounter: Payer: Self-pay | Admitting: *Deleted

## 2020-12-29 VITALS — BP 122/90 | HR 80 | Ht 70.0 in | Wt 174.0 lb

## 2020-12-29 DIAGNOSIS — I10 Essential (primary) hypertension: Secondary | ICD-10-CM | POA: Diagnosis not present

## 2020-12-29 DIAGNOSIS — N1831 Chronic kidney disease, stage 3a: Secondary | ICD-10-CM

## 2020-12-29 DIAGNOSIS — I2510 Atherosclerotic heart disease of native coronary artery without angina pectoris: Secondary | ICD-10-CM | POA: Diagnosis not present

## 2020-12-29 DIAGNOSIS — I428 Other cardiomyopathies: Secondary | ICD-10-CM

## 2020-12-29 DIAGNOSIS — I5042 Chronic combined systolic (congestive) and diastolic (congestive) heart failure: Secondary | ICD-10-CM

## 2020-12-29 DIAGNOSIS — I77810 Thoracic aortic ectasia: Secondary | ICD-10-CM

## 2020-12-29 MED ORDER — ENTRESTO 24-26 MG PO TABS: 1.0000 | ORAL_TABLET | Freq: Two times a day (BID) | ORAL | 3 refills | Status: DC

## 2020-12-29 MED ORDER — METOPROLOL SUCCINATE ER 25 MG PO TB24: 25.0000 mg | ORAL_TABLET | Freq: Every day | ORAL | 3 refills | Status: DC

## 2020-12-29 NOTE — Patient Instructions (Signed)
Medication Instructions:   Restart Toprol XL 25 mg Daily   *If you need a refill on your cardiac medications before your next appointment, please call your pharmacy*   Lab Work: NONE   If you have labs (blood work) drawn today and your tests are completely normal, you will receive your results only by: MyChart Message (if you have MyChart) OR A paper copy in the mail If you have any lab test that is abnormal or we need to change your treatment, we will call you to review the results.   Testing/Procedures: Your physician has requested that you have a lexiscan myoview. For further information please visit [email or web address] Please follow instruction sheet, as given.    Follow-Up: At [facility], you and your health needs are our priority.  As part of our continuing mission to provide you with exceptional heart care, we have created designated Provider Care Teams.  These Care Teams include your primary Cardiologist (physician) and Advanced Practice Providers (APPs -  Physician Assistants and Nurse Practitioners) who all work together to provide you with the care you need, when you need it.  We recommend signing up for the patient portal called "MyChart".  Sign up information is provided on this After Visit Summary.  MyChart is used to connect with patients for Virtual Visits (Telemedicine).  Patients are able to view lab/test results, encounter notes, upcoming appointments, etc.  Non-urgent messages can be sent to your provider as well.   To learn more about what you can do with MyChart, go to [email or web address].    Your next appointment:   3 month(s)  The format for your next appointment:   In Person  Provider:   Nona Dell, MD or Randall An, PA-C   Other Instructions Thank you for choosing [facility]!

## 2021-02-03 ENCOUNTER — Telehealth: Payer: Self-pay | Admitting: Student

## 2021-02-03 NOTE — Telephone Encounter (Signed)
Pt c/o medication issue:  1. Name of Medication: sacubitril-valsartan (ENTRESTO) 24-26 MG  2. How are you currently taking this medication (dosage and times per day)? Take 1 tablet by mouth 2 (two) times daily.  3. Are you having a reaction (difficulty breathing--STAT)? No   4. What is your medication issue? Pt is stating that when he went to refill this medication he was told the cost would be $90.. pt was advised by Randall An to reach out if he had this issue... please advise

## 2021-02-06 NOTE — Telephone Encounter (Signed)
Returned call to pt with no answer. Voicemail left to return call.  Will offer pt financial assistance through [employer].

## 2021-02-07 NOTE — Telephone Encounter (Signed)
Left message to return call to office.

## 2021-02-08 NOTE — Telephone Encounter (Signed)
Left pt a message informing him that I will be mailing him Entresto PAF forms.

## 2021-02-13 ENCOUNTER — Encounter (HOSPITAL_COMMUNITY): Payer: BLUE CROSS/BLUE SHIELD

## 2021-04-04 ENCOUNTER — Ambulatory Visit: Payer: BLUE CROSS/BLUE SHIELD | Admitting: Student

## 2021-06-05 NOTE — Progress Notes (Signed)
? ?Cardiology Office Note   ? ?Date:  06/06/2021  ? ?ID:  ROBERTSON MODGLIN, DOB 1955-12-10, MRN [id number] ? ?PCP:  Redmond School, MD  ?Cardiologist: Rozann Lesches, MD   ? ?Chief Complaint  ?Patient presents with  ? Follow-up  ?  Overdue Visit  ? ? ?History of Present Illness:   ? ?Manuel Hess is a 66 y.o. male  with past medical history of CAD (s/p NSTEMI in 2015 with cath showing occluded 3rd diagonal with medical management recommended), HFrEF (EF 35-40% by echo in 2015 and 10/2019, at 35% in 12/2020), HTN and HLD who presents to the office today for overdue follow-up. ? ?He was last examined by myself in 12/2020 and reported overall feeling well at that time and denied any recent chest pain or dyspnea on exertion. He was still taking Entresto and Spironolactone but had self-discontinued Toprol-XL in the interim for unclear reasons and was recommended to restart Toprol-XL 25 mg daily. Was recommended to start an SGLT2 inhibitor at his next visit pending reassessment of his renal function. Given his persistent cardiomyopathy, a follow-up Lexiscan Myoview was recommended for ischemic evaluation. It appears this was not obtained. ? ?In talking with the patient today, he reports doing well since his last office visit. He remains active at baseline and has started walking on the treadmill on a daily basis for at least 15 minutes on the days that he works and for 20 to 25 minutes on the weekends. He denies any chest pain or dyspnea on exertion with this. No recent palpitations, orthopnea, PND or pitting edema. ? ?He reports good compliance with his current medication regimen but is concerned about the cost of his medications going forward as he is planning to switch to Medicare later this year. ? ?Past Medical History:  ?Diagnosis Date  ? CHF (congestive heart failure) ([place])   ? a. EF was at 35-40% by echo in 2015 b. similar results by repeat imaging in 10/2019  ? Coronary artery disease   ? Non-ST elevation  myocardial infarction in March of 2015. Cardiac catheterization showed an occluded third diagonal (small-diameter with left to left collaterals), 50% mid LCX and EF of 40%.   ? Essential hypertension   ? Hyperlipidemia   ? MI (myocardial infarction) ([place])   ? ? ?Past Surgical History:  ?Procedure Laterality Date  ? CARDIAC CATHETERIZATION  05/2013  ? [place]  ? HERNIA REPAIR    ? ? ?Current Medications: ?Outpatient Medications Prior to Visit  ?Medication Sig Dispense Refill  ? aspirin 81 MG tablet Take 1 tablet (81 mg total) by mouth daily. 30 tablet 1  ? metoprolol succinate (TOPROL XL) 25 MG 24 hr tablet Take 1 tablet (25 mg total) by mouth daily. 90 tablet 3  ? sacubitril-valsartan (ENTRESTO) 24-26 MG Take 1 tablet by mouth 2 (two) times daily. 180 tablet 3  ? spironolactone (ALDACTONE) 25 MG tablet Take 0.5 tablets (12.5 mg total) by mouth daily. 90 tablet 3  ? ?No facility-administered medications prior to visit.  ?  ? ?Allergies:   Patient has no known allergies.  ? ?Social History  ? ?Socioeconomic History  ? Marital status: Single  ?  Spouse name: Not on file  ? Number of children: Not on file  ? Years of education: Not on file  ? Highest education level: Not on file  ?Occupational History  ? Not on file  ?Tobacco Use  ? Smoking status: Former  ?  Packs/day: 1.00  ?  Years: 20.00  ?  Pack years: 20.00  ?  Types: Cigarettes  ? Smokeless tobacco: Never  ?Vaping Use  ? Vaping Use: Never used  ?Substance and Sexual Activity  ? Alcohol use: Not Currently  ?  Comment: socially  ? Drug use: No  ?  Types: Marijuana  ?  Comment: past  ? Sexual activity: Not on file  ?Other Topics Concern  ? Not on file  ?Social History Narrative  ? Not on file  ? ?Social Determinants of Health  ? ?Financial Resource Strain: Not on file  ?Food Insecurity: Not on file  ?Transportation Needs: Not on file  ?Physical Activity: Not on file  ?Stress: Not on file  ?Social Connections: Not on file  ?  ? ?Family History:  The patient's family  history includes Heart attack (age of onset: 80) in his brother.  ? ?Review of Systems:   ? ?Please see the history of present illness.    ? ?All other systems reviewed and are otherwise negative except as noted above. ? ? ?Physical Exam:   ? ?VS:  BP 124/82   Pulse 78   Ht 5\' 10"  (1.778 m)   Wt 176 lb (79.8 kg)   SpO2 95%   BMI 25.25 kg/m?    ?General: Well developed, well nourished,male appearing in no acute distress. ?Head: Normocephalic, atraumatic. ?Neck: No carotid bruits. JVD not elevated.  ?Lungs: Respirations regular and unlabored, without wheezes or rales.  ?Heart: Regular rate and rhythm. No S3 or S4.  No murmur, no rubs, or gallops appreciated. ?Abdomen: Appears non-distended. No obvious abdominal masses. ?Msk:  Strength and tone appear normal for age. No obvious joint deformities or effusions. ?Extremities: No clubbing or cyanosis. No pitting edema.  Distal pedal pulses are 2+ bilaterally. ?Neuro: Alert and oriented X 3. Moves all extremities spontaneously. No focal deficits noted. ?Psych:  Responds to questions appropriately with a normal affect. ?Skin: No rashes or lesions noted ? ?Wt Readings from Last 3 Encounters:  ?06/06/21 176 lb (79.8 kg)  ?12/29/20 174 lb (78.9 kg)  ?09/21/20 172 lb 12.8 oz (78.4 kg)  ?  ? ?Studies/Labs Reviewed:  ? ?EKG:  EKG is ordered today. The ekg ordered today demonstrates NSR, HR 78 with no acute ST changes when compared to prior tracings.  ? ?Recent Labs: ?12/22/2020: BUN 12; Creatinine, Ser 1.45; Potassium 4.0; Sodium 141  ? ?Lipid Panel ?   ?Component Value Date/Time  ? CHOL 134 10/11/2020 1538  ? CHOL 143 07/06/2013 0848  ? CHOL 148 06/01/2013 0319  ? TRIG 112 10/11/2020 1538  ? TRIG 76 06/01/2013 0319  ? HDL 36 (L) 10/11/2020 1538  ? HDL 38 (L) 07/06/2013 0848  ? HDL 37 (L) 06/01/2013 0319  ? CHOLHDL 3.7 10/11/2020 1538  ? VLDL 22 10/11/2020 1538  ? VLDL 15 06/01/2013 0319  ? [place] 76 10/11/2020 1538  ? [place] 85 07/06/2013 0848  ? [place] 96 06/01/2013 0319   ? ? ?Additional studies/ records that were reviewed today include:  ? ?Cardiac Catheterization: 05/2013 ? ? ?Limited Echo: 12/2020 ?IMPRESSIONS  ? ? ? 1. Limited study.  ? 2. Left ventricular ejection fraction, by estimation, is approximately  ?35%. The left ventricle has moderately decreased function. The left  ?ventricle demonstrates regional wall motion abnormalities (see scoring  ?diagram/findings for description).  ? 3. The mitral valve is abnormal.  ? 4. Aortic dilatation noted. There is mild dilatation of the aortic root,  ?measuring 42 mm.  ? 5. The  inferior vena cava is normal in size with greater than 50%  ?respiratory variability, suggesting right atrial pressure of 3 mmHg.  ? ?Comparison(s): Prior images reviewed side by side. No significant change  ?in LVEF.  ? ?Assessment:   ? ?1. HFrEF (heart failure with reduced ejection fraction) ([place])   ?2. Nonischemic cardiomyopathy ([place])   ?3. Coronary artery disease involving native coronary artery of native heart without angina pectoris   ?4. Medication management   ?5. Essential hypertension   ?6. Stage 3a chronic kidney disease ([place])   ?7. Aortic root dilation ([facility])   ? ? ? ?Plan:  ? ?In order of problems listed above: ? ?1. HFrEF ?- His EF was at 35 to 40% by echocardiogram in 2015 with similar results by most recent limited echo in 12/2020. He has increased his activity at home and denies any recent anginal symptoms. No recent edema, orthopnea or PND and appears euvolemic on examination today. ?- He is concerned about the cost of his medications going forward as he is planning to switch to Medicare later this year and reviewed that he might be interested in obtaining a Medicare Part D plan to help with medication costs. If unable to obtain this, would need to consider patient assistance for [facility]. We did discuss the possible addition of an SGLT2 inhibitor but will defer at this time until he can make sure he is able to afford or be approved for  assistance for his current medications going forward. Continue Entresto 24-26 mg twice daily, Toprol-XL 25 mg daily and Spironolactone 12.5 mg daily.  Will obtain a follow-up BMET. If renal function and electrolytes remain sta

## 2021-06-06 ENCOUNTER — Other Ambulatory Visit: Payer: Self-pay

## 2021-06-06 ENCOUNTER — Ambulatory Visit: Payer: BLUE CROSS/BLUE SHIELD | Admitting: Student

## 2021-06-06 ENCOUNTER — Encounter: Payer: Self-pay | Admitting: Student

## 2021-06-06 VITALS — BP 124/82 | HR 78 | Ht 70.0 in | Wt 176.0 lb

## 2021-06-06 DIAGNOSIS — N1831 Chronic kidney disease, stage 3a: Secondary | ICD-10-CM

## 2021-06-06 DIAGNOSIS — Z79899 Other long term (current) drug therapy: Secondary | ICD-10-CM

## 2021-06-06 DIAGNOSIS — I5020 Unspecified systolic (congestive) heart failure: Secondary | ICD-10-CM

## 2021-06-06 DIAGNOSIS — I77810 Thoracic aortic ectasia: Secondary | ICD-10-CM

## 2021-06-06 DIAGNOSIS — I2510 Atherosclerotic heart disease of native coronary artery without angina pectoris: Secondary | ICD-10-CM

## 2021-06-06 DIAGNOSIS — I428 Other cardiomyopathies: Secondary | ICD-10-CM

## 2021-06-06 DIAGNOSIS — I10 Essential (primary) hypertension: Secondary | ICD-10-CM

## 2021-06-06 NOTE — Patient Instructions (Signed)
Medication Instructions:  ?Your physician recommends that you continue on your current medications as directed. Please refer to the Current Medication list given to you today. ? ? ?Labwork: ?BMET at [facility] ? ?Testing/Procedures: ?None today ? ?Follow-Up: ?3 months ? ?Any Other Special Instructions Will Be Listed Below (If Applicable). ? ?If you need a refill on your cardiac medications before your next appointment, please call your pharmacy. ? ?

## 2021-06-23 ENCOUNTER — Other Ambulatory Visit (HOSPITAL_COMMUNITY)
Admission: RE | Admit: 2021-06-23 | Discharge: 2021-06-23 | Disposition: A | Discharge status: Home / Self Care | Payer: BLUE CROSS/BLUE SHIELD | Source: Ambulatory Visit | Attending: Student | Admitting: Student

## 2021-06-23 DIAGNOSIS — Z79899 Other long term (current) drug therapy: Secondary | ICD-10-CM | POA: Diagnosis present

## 2021-06-23 LAB — BASIC METABOLIC PANEL
Anion gap: 7 (ref 5–15)
BUN: 16 mg/dL (ref 8–23)
CO2: 27 mmol/L (ref 22–32)
Calcium: 9.4 mg/dL (ref 8.9–10.3)
Chloride: 104 mmol/L (ref 98–111)
Creatinine, Ser: 1.38 mg/dL — ABNORMAL HIGH (ref 0.61–1.24)
GFR, Estimated: 57 mL/min — ABNORMAL LOW (ref >60)
Glucose, Bld: 100 mg/dL — ABNORMAL HIGH (ref 70–99)
Potassium: 4.1 mmol/L (ref 3.5–5.1)
Sodium: 138 mmol/L (ref 135–145)

## 2021-06-27 ENCOUNTER — Telehealth: Payer: Self-pay | Admitting: Student

## 2021-06-27 DIAGNOSIS — Z79899 Other long term (current) drug therapy: Secondary | ICD-10-CM

## 2021-06-27 NOTE — Telephone Encounter (Signed)
Left a message for pt to call back regarding results.

## 2021-06-27 NOTE — Telephone Encounter (Signed)
Patient returning call in regards to lab results.  

## 2021-06-28 NOTE — Telephone Encounter (Signed)
Left a message for pt to call back regarding results.

## 2021-07-04 MED ORDER — SPIRONOLACTONE 25 MG PO TABS: 25.0000 mg | ORAL_TABLET | Freq: Every day | ORAL | 3 refills | Status: DC

## 2021-07-04 NOTE — Telephone Encounter (Signed)
Pt notified and verbalized understanding. Pt had no questions or concerns at this time.  

## 2021-07-04 NOTE — Telephone Encounter (Signed)
Ellsworth Lennox, PA-C  ?06/23/2021  4:01 PM EDT   ?  ?Please let the patient know his electrolytes are within a normal range and his kidney function has actually improved when compared to prior values last year as his creatinine is now down to 1.38. I would recommend that we increase Spironolactone to 25 mg daily. Repeat BMET with [place] or PCP in 2-3 weeks for reassessment of K+ levels.   ? ?

## 2021-07-06 ENCOUNTER — Telehealth: Payer: Self-pay | Admitting: *Deleted

## 2021-07-06 DIAGNOSIS — Z79899 Other long term (current) drug therapy: Secondary | ICD-10-CM

## 2021-07-06 MED ORDER — SPIRONOLACTONE 25 MG PO TABS: 25.0000 mg | ORAL_TABLET | Freq: Every day | ORAL | 3 refills | Status: DC

## 2021-07-06 NOTE — Telephone Encounter (Signed)
-----   Message from Erma Heritage, [place] sent at 06/23/2021  4:01 PM EDT ----- ?Please let the patient know his electrolytes are within a normal range and his kidney function has actually improved when compared to prior values last year as his creatinine is now down to 1.38. I would recommend that we increase Spironolactone to 25 mg daily. Repeat BMET with [place] or PCP in 2-3 weeks for reassessment of K+ levels.  ?

## 2021-08-02 ENCOUNTER — Telehealth: Payer: Self-pay | Admitting: Student

## 2021-08-02 NOTE — Telephone Encounter (Signed)
Returned call to pt. No answer unable to leave msg.  ?

## 2021-08-02 NOTE — Telephone Encounter (Signed)
Pt c/o medication issue: ? ?1. Name of Medication: Spironolactone ? ?2. How are you currently taking this medication (dosage and times per day)? 1 tablet a day ? ?3. Are you having a reaction (difficulty breathing--STAT)?  ? ?4. What is your medication issue? Making him feel weak, woozy and dizzy. And nervous  He says he can feel his heart  racing- patient would like to go back to taking a half tablet ?

## 2021-08-03 ENCOUNTER — Emergency Department (HOSPITAL_COMMUNITY)
Admission: EM | Admit: 2021-08-03 | Discharge: 2021-08-03 | Disposition: A | Discharge status: Home / Self Care | Payer: BLUE CROSS/BLUE SHIELD | Attending: Emergency Medicine | Admitting: Emergency Medicine

## 2021-08-03 ENCOUNTER — Other Ambulatory Visit: Payer: Self-pay

## 2021-08-03 DIAGNOSIS — Z79899 Other long term (current) drug therapy: Secondary | ICD-10-CM | POA: Insufficient documentation

## 2021-08-03 DIAGNOSIS — I255 Ischemic cardiomyopathy: Secondary | ICD-10-CM | POA: Insufficient documentation

## 2021-08-03 DIAGNOSIS — R002 Palpitations: Secondary | ICD-10-CM | POA: Insufficient documentation

## 2021-08-03 DIAGNOSIS — Z7982 Long term (current) use of aspirin: Secondary | ICD-10-CM | POA: Insufficient documentation

## 2021-08-03 DIAGNOSIS — I5020 Unspecified systolic (congestive) heart failure: Secondary | ICD-10-CM | POA: Diagnosis not present

## 2021-08-03 DIAGNOSIS — R531 Weakness: Secondary | ICD-10-CM | POA: Diagnosis present

## 2021-08-03 LAB — COMPREHENSIVE METABOLIC PANEL
ALT: 11 U/L (ref 0–44)
AST: 14 U/L — ABNORMAL LOW (ref 15–41)
Albumin: 3.7 g/dL (ref 3.5–5.0)
Alkaline Phosphatase: 55 U/L (ref 38–126)
Anion gap: 7 (ref 5–15)
BUN: 14 mg/dL (ref 8–23)
CO2: 27 mmol/L (ref 22–32)
Calcium: 8.9 mg/dL (ref 8.9–10.3)
Chloride: 103 mmol/L (ref 98–111)
Creatinine, Ser: 1.43 mg/dL — ABNORMAL HIGH (ref 0.61–1.24)
GFR, Estimated: 54 mL/min — ABNORMAL LOW (ref >60)
Glucose, Bld: 114 mg/dL — ABNORMAL HIGH (ref 70–99)
Potassium: 3.8 mmol/L (ref 3.5–5.1)
Sodium: 137 mmol/L (ref 135–145)
Total Bilirubin: 0.2 mg/dL — ABNORMAL LOW (ref 0.3–1.2)
Total Protein: 7.6 g/dL (ref 6.5–8.1)

## 2021-08-03 LAB — CBC WITH DIFFERENTIAL/PLATELET
Abs Immature Granulocytes: 0.03 10*3/uL (ref 0.00–0.07)
Basophils Absolute: 0 10*3/uL (ref 0.0–0.1)
Basophils Relative: 1 %
Eosinophils Absolute: 0.1 10*3/uL (ref 0.0–0.5)
Eosinophils Relative: 2 %
HCT: 45 % (ref 39.0–52.0)
Hemoglobin: 13.6 g/dL (ref 13.0–17.0)
Immature Granulocytes: 0 %
Lymphocytes Relative: 22 %
Lymphs Abs: 1.6 10*3/uL (ref 0.7–4.0)
MCH: 26.1 pg (ref 26.0–34.0)
MCHC: 30.2 g/dL (ref 30.0–36.0)
MCV: 86.2 fL (ref 80.0–100.0)
Monocytes Absolute: 0.6 10*3/uL (ref 0.1–1.0)
Monocytes Relative: 8 %
Neutro Abs: 4.7 10*3/uL (ref 1.7–7.7)
Neutrophils Relative %: 67 %
Platelets: 187 10*3/uL (ref 150–400)
RBC: 5.22 MIL/uL (ref 4.22–5.81)
RDW: 12.9 % (ref 11.5–15.5)
WBC: 7 10*3/uL (ref 4.0–10.5)
nRBC: 0 % (ref 0.0–0.2)

## 2021-08-03 NOTE — ED Provider Notes (Signed)
[facility] EMERGENCY DEPARTMENT Provider Note   CSN: [id number] Arrival date & time: 08/03/21  2016     History  Chief Complaint  Patient presents with   Palpitations    Manuel Hess is a 66 y.o. male.  HPI 66 year old male with a history of systolic CHF presents with "wooziness".  Feels a little dizzy/weak.  He has been having this ever since he was put on spironolactone and so it was reduced to 1/2 tablet.  He was doing better with this and a couple weeks ago his cardiologist told him to go back up to a full dose.  Started getting woozy again.  He called his doctor but did not receive response so he just stopped it yesterday.  Did not take it today either.  However he still having some wooziness on and off and feels generally weak.  At the moment I am talking to him he actually feels a lot better.  No chest pain or shortness of breath.  No leg swelling or vomiting.   Home Medications Prior to Admission medications   Medication Sig Start Date End Date Taking? Authorizing Provider  aspirin 81 MG tablet Take 1 tablet (81 mg total) by mouth daily. 05/19/14  Yes Wellington Hampshire, MD  metoprolol succinate (TOPROL XL) 25 MG 24 hr tablet Take 1 tablet (25 mg total) by mouth daily. 12/29/20  Yes Strader, [place] M, PA-C  sacubitril-valsartan (ENTRESTO) 24-26 MG Take 1 tablet by mouth 2 (two) times daily. 12/29/20  Yes Strader, [place], PA-C  spironolactone (ALDACTONE) 25 MG tablet Take 1 tablet (25 mg total) by mouth daily. 07/06/21 07/01/22 Yes Strader, Fransisco Hertz, PA-C      Allergies    Patient has no known allergies.    Review of Systems   Review of Systems  Constitutional:  Positive for fatigue. Negative for fever.  Respiratory:  Negative for shortness of breath.   Cardiovascular:  Negative for chest pain and palpitations.  Gastrointestinal:  Negative for abdominal pain.  Neurological:  Positive for weakness and light-headedness.   Physical Exam Updated Vital Signs BP (!)  124/97   Pulse 72   Temp 97.8 F (36.6 C)   Resp 18   SpO2 95%  Physical Exam Vitals and nursing note reviewed.  Constitutional:      Appearance: He is well-developed.  HENT:     Head: Normocephalic and atraumatic.  Eyes:     Extraocular Movements: Extraocular movements intact.     Pupils: Pupils are equal, round, and reactive to light.  Cardiovascular:     Rate and Rhythm: Normal rate and regular rhythm.     Heart sounds: Normal heart sounds.  Pulmonary:     Effort: Pulmonary effort is normal.     Breath sounds: Normal breath sounds.  Abdominal:     Palpations: Abdomen is soft.     Tenderness: There is no abdominal tenderness.  Skin:    General: Skin is warm and dry.  Neurological:     Mental Status: He is alert.     Comments: CN 3-12 grossly intact. 5/5 strength in all 4 extremities. Grossly normal sensation. Normal finger to nose.     ED Results / Procedures / Treatments   Labs (all labs ordered are listed, but only abnormal results are displayed) Labs Reviewed  COMPREHENSIVE METABOLIC PANEL - Abnormal; Notable for the following components:      Result Value   Glucose, Bld 114 (*)    Creatinine, Ser 1.43 (*)  AST 14 (*)    Total Bilirubin 0.2 (*)    GFR, Estimated 54 (*)    All other components within normal limits  CBC WITH DIFFERENTIAL/PLATELET    EKG EKG Interpretation  Date/Time:  Thursday Aug 03 2021 20:53:17 EDT Ventricular Rate:  79 PR Interval:  172 QRS Duration: 93 QT Interval:  399 QTC Calculation: 458 R Axis:   21 Text Interpretation: Sinus rhythm Borderline low voltage, extremity leads Confirmed by Sherwood Gambler [phone]) on 08/03/2021 11:22:58 PM  Radiology No results found.  Procedures Procedures    Medications Ordered in ED Medications - No data to display  ED Course/ Medical Decision Making/ A&P                           Medical Decision Making Amount and/or Complexity of Data Reviewed External Data Reviewed: notes. Labs:  ordered. ECG/medicine tests: ordered and independent interpretation performed.   Patient has a history of systolic CHF which is likely why he is on the spironolactone.  However at this time he appears well and has benign vital signs.  No focal deficits.  No chest pain or anginal equivalent symptoms.  ECG interpreted by myself and is unremarkable compared to baseline.  Labs interpreted and there is no hypo or hyperkalemia or significant abnormality compared to baseline.  No anemia.  At this point, I think he is stable for outpatient follow-up with his cardiologist for further medication titration.        Final Clinical Impression(s) / ED Diagnoses Final diagnoses:  Generalized weakness  Cardiomyopathy, ischemic    Rx / DC Orders ED Discharge Orders          Ordered    Ambulatory referral to Cardiology       Comments: If you have not heard from the Cardiology office within the next 72 hours please call [phone].   08/03/21 [id number]              Sherwood Gambler, MD 08/03/21 2329

## 2021-08-03 NOTE — ED Triage Notes (Signed)
Pt reports that he feel jittery and having palpitations.  Reports this started after taking spirolactone.  Reports started taking a whole pill today.  States that he started feeling "Funny" today.  Resp even and unlabored.  Denies chest pain.

## 2021-08-03 NOTE — Telephone Encounter (Signed)
Says call cannot be completed as dialed.

## 2021-08-04 MED ORDER — SPIRONOLACTONE 25 MG PO TABS: 12.5000 mg | ORAL_TABLET | Freq: Every day | ORAL | 3 refills | Status: DC

## 2021-08-04 NOTE — Telephone Encounter (Signed)
Returned call to pt. No answer. Voicemail full.

## 2021-08-04 NOTE — Telephone Encounter (Signed)
Patient returning call.

## 2021-08-04 NOTE — Telephone Encounter (Signed)
Pt states that after starting Spironolactone 25 mg he started to feel dizzy and woozy and weak. Pt is unable to provide current BP's. Pt states that the last day he took full dose was on Wednesday. He did not take Spironolactone on yesterday or today. Please advise.

## 2021-08-04 NOTE — Telephone Encounter (Signed)
    Appears he was evaluated in the Emergency Dept yesterday and work-up was reassuring. If he felt better on Spironolactone 12.5mg  daily, can reduce from 25mg  daily to 12.5mg  daily.   Signed, , PA-C 08/04/2021, 2:37 PM

## 2021-08-04 NOTE — Telephone Encounter (Signed)
Pt notified and voiced he would like to take 12.5 mg daily.

## 2021-09-05 NOTE — Progress Notes (Unsigned)
Cardiology Office Note    Date:  09/06/2021   ID:  Manuel Hess, DOB Oct 19, 1955, MRN [id number]  PCP:  Elfredia Nevins, MD  Cardiologist: Nona Dell, MD    Chief Complaint  Patient presents with   Follow-up    3 month visit    History of Present Illness:    Manuel Hess is a 66 y.o. male with past medical history of CAD (s/p NSTEMI in 2015 with cath showing occluded 3rd diagonal with medical management recommended), HFrEF (EF 35-40% by echo in 2015 and 10/2019, at 35% in 12/2020), HTN and HLD who presents to the office today for 26-month follow-up.   He was examined by myself in 05/2021 and was exercising for at least 15-20 minutes a day on the treadmill and denied any recent anginal symptoms. He was switching to Medicare and was concerned about his prescription costs. He was continued on Entresto 24-26mg  BID, Toprol-XL 25mg  daily and Spironolactone 12.5mg  daily with plans to further titrate if renal function remained stable. Was not started on an SGLT2 inhibitor yet given cost concerns and he did not wish to pursue ischemic evaluation at that time given no recent anginal symptoms. Spironolactone was titrated to 25mg  daily but he called the office last month reporting dizziness with the medication, therefore this was reduced back to 12.5mg  daily.    In talking with the patient today, he reports feeling well since his last visit. He continues to exercise several days a week and denies any recent chest pain or dyspnea on exertion. No recent palpitations, orthopnea, PND or pitting edema. Reports his prior dizziness resolved with dose reduction of Spironolactone. He reports good compliance with this along with his Entresto, Toprol-XL and ASA.  Past Medical History:  Diagnosis Date   CHF (congestive heart failure) (HCC)    a. EF was at 35-40% by echo in 2015 b. similar results by repeat imaging in 10/2019   Coronary artery disease    Non-ST elevation myocardial infarction in March of  2015. Cardiac catheterization showed an occluded third diagonal (small-diameter with left to left collaterals), 50% mid LCX and EF of 40%.    Essential hypertension    Hyperlipidemia    MI (myocardial infarction) [facility])     Past Surgical History:  Procedure Laterality Date   CARDIAC CATHETERIZATION  05/2013   [facility]   HERNIA REPAIR      Current Medications: Outpatient Medications Prior to Visit  Medication Sig Dispense Refill   aspirin 81 MG tablet Take 1 tablet (81 mg total) by mouth daily. 30 tablet 1   metoprolol succinate (TOPROL XL) 25 MG 24 hr tablet Take 1 tablet (25 mg total) by mouth daily. 90 tablet 3   sacubitril-valsartan (ENTRESTO) 24-26 MG Take 1 tablet by mouth 2 (two) times daily. 180 tablet 3   spironolactone (ALDACTONE) 25 MG tablet Take 0.5 tablets (12.5 mg total) by mouth daily. 45 tablet 3   No facility-administered medications prior to visit.     Allergies:   Patient has no known allergies.   Social History   Socioeconomic History   Marital status: Single    Spouse name: Not on file   Number of children: Not on file   Years of education: Not on file   Highest education level: Not on file  Occupational History   Not on file  Tobacco Use   Smoking status: Former    Packs/day: 1.00    Years: 20.00    Total pack years:  20.00    Types: Cigarettes   Smokeless tobacco: Never  Vaping Use   Vaping Use: Never used  Substance and Sexual Activity   Alcohol use: Not Currently    Comment: socially   Drug use: No    Types: Marijuana    Comment: past   Sexual activity: Not on file  Other Topics Concern   Not on file  Social History Narrative   Not on file   Social Determinants of Health   Financial Resource Strain: Not on file  Food Insecurity: Not on file  Transportation Needs: Not on file  Physical Activity: Not on file  Stress: Not on file  Social Connections: Not on file     Family History:  The patient's family history includes Heart attack  (age of onset: 49) in his brother.   Review of Systems:    Please see the history of present illness.     All other systems reviewed and are otherwise negative except as noted above.   Physical Exam:    VS:  BP 118/74   Pulse 86   Ht 5\' 10"  (1.778 m)   Wt 171 lb (77.6 kg)   SpO2 96%   BMI 24.54 kg/m    General: Well developed, well nourished,male appearing in no acute distress. Head: Normocephalic, atraumatic. Neck: No carotid bruits. JVD not elevated.  Lungs: Respirations regular and unlabored, without wheezes or rales.  Heart: Regular rate and rhythm. No S3 or S4.  No murmur, no rubs, or gallops appreciated. Abdomen: Appears non-distended. No obvious abdominal masses. Msk:  Strength and tone appear normal for age. No obvious joint deformities or effusions. Extremities: No clubbing or cyanosis. No pitting edema.  Distal pedal pulses are 2+ bilaterally. Neuro: Alert and oriented X 3. Moves all extremities spontaneously. No focal deficits noted. Psych:  Responds to questions appropriately with a normal affect. Skin: No rashes or lesions noted  Wt Readings from Last 3 Encounters:  09/06/21 171 lb (77.6 kg)  06/06/21 176 lb (79.8 kg)  12/29/20 174 lb (78.9 kg)     Studies/Labs Reviewed:   EKG:  EKG is not ordered today.   Recent Labs: 08/03/2021: ALT 11; BUN 14; Creatinine, Ser 1.43; Hemoglobin 13.6; Platelets 187; Potassium 3.8; Sodium 137   Lipid Panel    Component Value Date/Time   CHOL 134 10/11/2020 1538   CHOL 143 07/06/2013 0848   CHOL 148 06/01/2013 0319   TRIG 112 10/11/2020 1538   TRIG 76 06/01/2013 0319   HDL 36 (L) 10/11/2020 1538   HDL 38 (L) 07/06/2013 0848   HDL 37 (L) 06/01/2013 0319   CHOLHDL 3.7 10/11/2020 1538   VLDL 22 10/11/2020 1538   VLDL 15 06/01/2013 0319   LDLCALC 76 10/11/2020 1538   LDLCALC 85 07/06/2013 0848   LDLCALC 96 06/01/2013 0319    Additional studies/ records that were reviewed today include:   Limited Echo:  12/22/2020 IMPRESSIONS     1. Limited study.   2. Left ventricular ejection fraction, by estimation, is approximately  35%. The left ventricle has moderately decreased function. The left  ventricle demonstrates regional wall motion abnormalities (see scoring  diagram/findings for description).   3. The mitral valve is abnormal.   4. Aortic dilatation noted. There is mild dilatation of the aortic root,  measuring 42 mm.   5. The inferior vena cava is normal in size with greater than 50%  respiratory variability, suggesting right atrial pressure of 3 mmHg.   Comparison(s): Prior  images reviewed side by side. No significant change  in LVEF.   Assessment:    1. Coronary artery disease involving native coronary artery of native heart without angina pectoris   2. HFrEF (heart failure with reduced ejection fraction) (HCC)   3. Essential hypertension   4. Hyperlipidemia LDL goal <70   5. Stage 3a chronic kidney disease (HCC)      Plan:   In order of problems listed above:  1. CAD - He is s/p NSTEMI in 2015 with cath showing occluded 3rd diagonal with medical management recommended. We previously reviewed a repeat stress test but he declined at that time given no recent anginal symptoms. Would consider this again in the future if he develops any new symptoms or EF remains reduced by echocardiogram imaging. - Continue ASA 81 mg daily and Toprol-XL 25 mg daily. He previously self discontinued statin therapy due to myalgias and LDL was down to 76 on most recent check..  2. HFrEF - His EF was at 35-40% by echo in 2015 and 10/2019, at 35% in 12/2020. He remains active at baseline denies any recent orthopnea, PND or pitting edema. Continue Entresto 24-26 mg twice daily, Toprol-XL 25 mg daily and Spironolactone 12.5 mg daily. He previously did not tolerate higher doses of Spironolactone secondary to dizziness. Reviewed options with the patient and will try adding Farxiga 10 mg daily. 30-day card  and co-pay card provided. Repeat BMET in 2-3 weeks. Would plan for a repeat limited echocardiogram in 3 months for reassessment of his EF.  3. HTN - His blood pressure is well-controlled at 118/74 during today's visit. Continue Entresto, Toprol-XL and Spironolactone at current dosing.  4. HLD - Followed by his PCP. He was previously on statin therapy but developed myalgias with this. LDL was down to 76 in 09/2020.  5. Stage 3 CKD - His creatinine was at 1.43 in 07/2021 which is close to his known baseline. Repeat BMET in 2-3 weeks.    Medication Adjustments/Labs and Tests Ordered: Current medicines are reviewed at length with the patient today.  Concerns regarding medicines are outlined above.  Medication changes, Labs and Tests ordered today are listed in the Patient Instructions below. Patient Instructions  Medication Instructions:   Start Farxiga 10mg  daily.   *If you need a refill on your cardiac medications before your next appointment, please call your pharmacy*   Lab Work:  BMET in 2-3 weeks.   If you have labs (blood work) drawn today and your tests are completely normal, you will receive your results only by: MyChart Message (if you have MyChart) OR A paper copy in the mail If you have any lab test that is abnormal or we need to change your treatment, we will call you to review the results.   Testing/Procedures:  Echocardiogram in 3 months   Follow-Up: At [facility], you and your health needs are our priority.  As part of our continuing mission to provide you with exceptional heart care, we have created designated Provider Care Teams.  These Care Teams include your primary Cardiologist (physician) and Advanced Practice Providers (APPs -  Physician Assistants and Nurse Practitioners) who all work together to provide you with the care you need, when you need it.  We recommend signing up for the patient portal called "MyChart".  Sign up information is provided on  this After Visit Summary.  MyChart is used to connect with patients for Virtual Visits (Telemedicine).  Patients are able to view lab/test results, encounter  notes, upcoming appointments, etc.  Non-urgent messages can be sent to your provider as well.   To learn more about what you can do with MyChart, go to [email or web address].    Your next appointment:   3 month(s)  The format for your next appointment:   In Person  Provider:  With Dr. Diona Browner or Randall An, PA  Important Information About Sugar         Signed, Ellsworth Lennox, PA-C  09/06/2021 5:17 PM    [facility] [address] [place], [place] [address] Phone: [phone] Fax: [phone]

## 2021-09-06 ENCOUNTER — Ambulatory Visit: Payer: BLUE CROSS/BLUE SHIELD | Admitting: Student

## 2021-09-06 ENCOUNTER — Encounter: Payer: Self-pay | Admitting: Student

## 2021-09-06 VITALS — BP 118/74 | HR 86 | Ht 70.0 in | Wt 171.0 lb

## 2021-09-06 DIAGNOSIS — I2510 Atherosclerotic heart disease of native coronary artery without angina pectoris: Secondary | ICD-10-CM

## 2021-09-06 DIAGNOSIS — I5020 Unspecified systolic (congestive) heart failure: Secondary | ICD-10-CM

## 2021-09-06 DIAGNOSIS — E785 Hyperlipidemia, unspecified: Secondary | ICD-10-CM | POA: Diagnosis not present

## 2021-09-06 DIAGNOSIS — I10 Essential (primary) hypertension: Secondary | ICD-10-CM | POA: Diagnosis not present

## 2021-09-06 DIAGNOSIS — N1831 Chronic kidney disease, stage 3a: Secondary | ICD-10-CM

## 2021-09-06 MED ORDER — DAPAGLIFLOZIN PROPANEDIOL 10 MG PO TABS: 10.0000 mg | ORAL_TABLET | Freq: Every day | ORAL | 11 refills | Status: DC

## 2021-09-06 NOTE — Patient Instructions (Signed)
Medication Instructions:   Start Farxiga 10mg  daily.   *If you need a refill on your cardiac medications before your next appointment, please call your pharmacy*   Lab Work:  BMET in 2-3 weeks.   If you have labs (blood work) drawn today and your tests are completely normal, you will receive your results only by: MyChart Message (if you have MyChart) OR A paper copy in the mail If you have any lab test that is abnormal or we need to change your treatment, we will call you to review the results.   Testing/Procedures:  Echocardiogram in 3 months   Follow-Up: At [facility], you and your health needs are our priority.  As part of our continuing mission to provide you with exceptional heart care, we have created designated Provider Care Teams.  These Care Teams include your primary Cardiologist (physician) and Advanced Practice Providers (APPs -  Physician Assistants and Nurse Practitioners) who all work together to provide you with the care you need, when you need it.  We recommend signing up for the patient portal called "MyChart".  Sign up information is provided on this After Visit Summary.  MyChart is used to connect with patients for Virtual Visits (Telemedicine).  Patients are able to view lab/test results, encounter notes, upcoming appointments, etc.  Non-urgent messages can be sent to your provider as well.   To learn more about what you can do with MyChart, go to [facility].    Your next appointment:   3 month(s)  The format for your next appointment:   In Person  Provider:  With Dr. [email or web address] or Diona Browner, PA  Important Information About Sugar

## 2021-10-06 ENCOUNTER — Other Ambulatory Visit (HOSPITAL_COMMUNITY)
Admission: RE | Admit: 2021-10-06 | Discharge: 2021-10-06 | Disposition: A | Discharge status: Home / Self Care | Payer: BLUE CROSS/BLUE SHIELD | Source: Ambulatory Visit | Attending: Student | Admitting: Student

## 2021-10-06 DIAGNOSIS — I5020 Unspecified systolic (congestive) heart failure: Secondary | ICD-10-CM

## 2021-10-06 DIAGNOSIS — I10 Essential (primary) hypertension: Secondary | ICD-10-CM | POA: Diagnosis present

## 2021-10-06 LAB — BASIC METABOLIC PANEL
Anion gap: 6 (ref 5–15)
BUN: 14 mg/dL (ref 8–23)
CO2: 26 mmol/L (ref 22–32)
Calcium: 9 mg/dL (ref 8.9–10.3)
Chloride: 106 mmol/L (ref 98–111)
Creatinine, Ser: 1.53 mg/dL — ABNORMAL HIGH (ref 0.61–1.24)
GFR, Estimated: 50 mL/min — ABNORMAL LOW (ref >60)
Glucose, Bld: 104 mg/dL — ABNORMAL HIGH (ref 70–99)
Potassium: 3.4 mmol/L — ABNORMAL LOW (ref 3.5–5.1)
Sodium: 138 mmol/L (ref 135–145)

## 2021-10-09 ENCOUNTER — Telehealth: Payer: Self-pay | Admitting: *Deleted

## 2021-10-09 DIAGNOSIS — Z79899 Other long term (current) drug therapy: Secondary | ICD-10-CM

## 2021-10-09 NOTE — Telephone Encounter (Signed)
-----   Message from Ellsworth Lennox, [place] sent at 10/06/2021  3:46 PM EDT ----- Please let the patient know his potassium is slightly low at 3.4. Confirm that he is still taking Spironolactone 12.5 mg daily. If he is, would increase his intake of potassium rich foods (bananas, tomatoes, greens, etc). Creatinine elevated at 1.53 but similar to prior values over the past year. Please see how he is feeling with [place]. If overall doing well with this, would recheck his BMET again in 3-4 weeks for reassessment of his K+ levels.

## 2021-10-09 NOTE — Telephone Encounter (Signed)
Lap order placed and pt notified

## 2021-10-30 ENCOUNTER — Other Ambulatory Visit (HOSPITAL_COMMUNITY)
Admission: RE | Admit: 2021-10-30 | Discharge: 2021-10-30 | Disposition: A | Discharge status: Home / Self Care | Payer: BLUE CROSS/BLUE SHIELD | Source: Ambulatory Visit | Attending: Student | Admitting: Student

## 2021-10-30 DIAGNOSIS — Z79899 Other long term (current) drug therapy: Secondary | ICD-10-CM | POA: Diagnosis present

## 2021-10-30 LAB — BASIC METABOLIC PANEL
Anion gap: 7 (ref 5–15)
BUN: 17 mg/dL (ref 8–23)
CO2: 26 mmol/L (ref 22–32)
Calcium: 9.6 mg/dL (ref 8.9–10.3)
Chloride: 108 mmol/L (ref 98–111)
Creatinine, Ser: 1.56 mg/dL — ABNORMAL HIGH (ref 0.61–1.24)
GFR, Estimated: 49 mL/min — ABNORMAL LOW (ref >60)
Glucose, Bld: 124 mg/dL — ABNORMAL HIGH (ref 70–99)
Potassium: 4.3 mmol/L (ref 3.5–5.1)
Sodium: 141 mmol/L (ref 135–145)

## 2021-12-11 ENCOUNTER — Ambulatory Visit (HOSPITAL_COMMUNITY): Admission: RE | Admit: 2021-12-11 | Payer: BLUE CROSS/BLUE SHIELD | Source: Ambulatory Visit

## 2021-12-21 ENCOUNTER — Encounter: Payer: Self-pay | Admitting: Student

## 2021-12-21 ENCOUNTER — Ambulatory Visit: Payer: BLUE CROSS/BLUE SHIELD | Attending: Student | Admitting: Student

## 2021-12-21 VITALS — BP 114/72 | HR 93 | Ht 70.0 in | Wt 170.0 lb

## 2021-12-21 DIAGNOSIS — I2510 Atherosclerotic heart disease of native coronary artery without angina pectoris: Secondary | ICD-10-CM

## 2021-12-21 DIAGNOSIS — I10 Essential (primary) hypertension: Secondary | ICD-10-CM

## 2021-12-21 DIAGNOSIS — I5020 Unspecified systolic (congestive) heart failure: Secondary | ICD-10-CM

## 2021-12-21 DIAGNOSIS — I77810 Thoracic aortic ectasia: Secondary | ICD-10-CM

## 2021-12-21 DIAGNOSIS — N1831 Chronic kidney disease, stage 3a: Secondary | ICD-10-CM

## 2021-12-21 DIAGNOSIS — E785 Hyperlipidemia, unspecified: Secondary | ICD-10-CM

## 2021-12-21 NOTE — Progress Notes (Signed)
Cardiology Office Note    Date:  12/21/2021   ID:  Manuel Hess, DOB 09/04/1955, MRN [id number]  PCP:  Redmond School, MD  Cardiologist: Rozann Lesches, MD    Chief Complaint  Patient presents with   Follow-up    3 month visit    History of Present Illness:    Manuel Hess is a 66 y.o. male with past medical history of CAD (s/p NSTEMI in 2015 with cath showing occluded 3rd diagonal with medical management recommended), HFrEF (EF 35-40% by echo in 2015 and 10/2019, at 35% in 12/2020), Stage 3 CKD, HTN and HLD who presents to the office today for 22-month follow-up.  He was examined by myself in 08/2021 and denied any recent anginal symptoms at that time. He did have dizziness with prior titration of Spironolactone but this resolved with dose reduction of the medication. He was continued on Entresto 24-26 mg twice daily, Toprol-XL 25 mg daily and Spironolactone 12.5 mg daily with Farxiga 10 mg daily being added to his medication regimen. It was recommended to repeat an echocardiogram in 3 months for reassessment of his EF but it appears he no showed for the visit on 12/11/2021.  In talking with the patient today, he reports overall feeling well since his last office visit. He continues to remain active at home and works full-time. He denies any recent chest pain or dyspnea on exertion. No recent palpitations, orthopnea, PND or pitting edema. He has overall tolerated [place] well and denies any side effects. Reports his dizziness did previously resolve with dose reduction of Spironolactone to 12.5 mg daily.   Past Medical History:  Diagnosis Date   CHF (congestive heart failure) ([place])    a. EF was at 35-40% by echo in 2015 b. similar results by repeat imaging in 10/2019   Coronary artery disease    Non-ST elevation myocardial infarction in March of 2015. Cardiac catheterization showed an occluded third diagonal (small-diameter with left to left collaterals), 50% mid LCX and EF of 40%.     Essential hypertension    Hyperlipidemia    MI (myocardial infarction) [facility])     Past Surgical History:  Procedure Laterality Date   CARDIAC CATHETERIZATION  05/2013   [facility]   HERNIA REPAIR      Current Medications: Outpatient Medications Prior to Visit  Medication Sig Dispense Refill   aspirin 81 MG tablet Take 1 tablet (81 mg total) by mouth daily. 30 tablet 1   dapagliflozin propanediol (FARXIGA) 10 MG TABS tablet Take 1 tablet (10 mg total) by mouth daily. 30 tablet 11   metoprolol succinate (TOPROL XL) 25 MG 24 hr tablet Take 1 tablet (25 mg total) by mouth daily. 90 tablet 3   sacubitril-valsartan (ENTRESTO) 24-26 MG Take 1 tablet by mouth 2 (two) times daily. 180 tablet 3   spironolactone (ALDACTONE) 25 MG tablet Take 0.5 tablets (12.5 mg total) by mouth daily. 45 tablet 3   No facility-administered medications prior to visit.     Allergies:   Patient has no known allergies.   Social History   Socioeconomic History   Marital status: Single    Spouse name: Not on file   Number of children: Not on file   Years of education: Not on file   Highest education level: Not on file  Occupational History   Not on file  Tobacco Use   Smoking status: Former    Packs/day: 1.00    Years: 20.00    Total  pack years: 20.00    Types: Cigarettes   Smokeless tobacco: Never  Vaping Use   Vaping Use: Never used  Substance and Sexual Activity   Alcohol use: Not Currently    Comment: socially   Drug use: No    Types: Marijuana    Comment: past   Sexual activity: Not on file  Other Topics Concern   Not on file  Social History Narrative   Not on file   Social Determinants of Health   Financial Resource Strain: Not on file  Food Insecurity: Not on file  Transportation Needs: Not on file  Physical Activity: Not on file  Stress: Not on file  Social Connections: Not on file     Family History:  The patient's family history includes Heart attack (age of onset: 1) in his  brother.   Review of Systems:    Please see the history of present illness.     All other systems reviewed and are otherwise negative except as noted above.   Physical Exam:    VS:  BP 114/72   Pulse 93   Ht 5\' 10"  (1.778 m)   Wt 170 lb (77.1 kg)   SpO2 95%   BMI 24.39 kg/m    General: Well developed, well nourished,male appearing in no acute distress. Head: Normocephalic, atraumatic. Neck: No carotid bruits. JVD not elevated.  Lungs: Respirations regular and unlabored, without wheezes or rales.  Heart: Regular rate and rhythm. No S3 or S4.  No murmur, no rubs, or gallops appreciated. Abdomen: Appears non-distended. No obvious abdominal masses. Msk:  Strength and tone appear normal for age. No obvious joint deformities or effusions. Extremities: No clubbing or cyanosis. No pitting edema.  Distal pedal pulses are 2+ bilaterally. Neuro: Alert and oriented X 3. Moves all extremities spontaneously. No focal deficits noted. Psych:  Responds to questions appropriately with a normal affect. Skin: No rashes or lesions noted  Wt Readings from Last 3 Encounters:  12/21/21 170 lb (77.1 kg)  09/06/21 171 lb (77.6 kg)  06/06/21 176 lb (79.8 kg)     Studies/Labs Reviewed:   EKG:  EKG is not ordered today.   Recent Labs: 08/03/2021: ALT 11; Hemoglobin 13.6; Platelets 187 10/30/2021: BUN 17; Creatinine, Ser 1.56; Potassium 4.3; Sodium 141   Lipid Panel    Component Value Date/Time   CHOL 134 10/11/2020 1538   CHOL 143 07/06/2013 0848   CHOL 148 06/01/2013 0319   TRIG 112 10/11/2020 1538   TRIG 76 06/01/2013 0319   HDL 36 (L) 10/11/2020 1538   HDL 38 (L) 07/06/2013 0848   HDL 37 (L) 06/01/2013 0319   CHOLHDL 3.7 10/11/2020 1538   VLDL 22 10/11/2020 1538   VLDL 15 06/01/2013 0319   LDLCALC 76 10/11/2020 1538   LDLCALC 85 07/06/2013 0848   LDLCALC 96 06/01/2013 0319    Additional studies/ records that were reviewed today include:   Limited Echo: 12/2020 IMPRESSIONS      1. Limited study.   2. Left ventricular ejection fraction, by estimation, is approximately  35%. The left ventricle has moderately decreased function. The left  ventricle demonstrates regional wall motion abnormalities (see scoring  diagram/findings for description).   3. The mitral valve is abnormal.   4. Aortic dilatation noted. There is mild dilatation of the aortic root,  measuring 42 mm.   5. The inferior vena cava is normal in size with greater than 50%  respiratory variability, suggesting right atrial pressure of 3 mmHg.  Assessment:    1. Coronary artery disease involving native coronary artery of native heart without angina pectoris   2. HFrEF (heart failure with reduced ejection fraction) (HCC)   3. Essential hypertension   4. Hyperlipidemia LDL goal <70   5. Stage 3a chronic kidney disease (HCC)   6. Aortic root dilation (HCC)      Plan:   In order of problems listed above:  1. CAD - He did have an NSTEMI in 2015 with cath showing occluded 3rd diagonal with medical management recommended. We previously discussed a repeat stress test but he declined given no recent symptoms. - He denies any recent anginal symptoms. Remains on ASA 81 mg daily and Toprol-XL 25 mg daily.  2. HFrEF - His EF was at 35-40% by echo in 2015 and at 35% in 12/2020. He says he forgot about his prior echocardiogram appointment and we will get this rescheduled for reassessment of his EF. Will continue current medical therapy for now with Farxiga 10 mg daily, Entresto 24-26 mg twice daily, Toprol-XL 25 mg daily and Spironolactone 12.5 mg daily. He previously had dizziness with further titration of Spironolactone.  3. HTN - His blood pressure is well-controlled at 114/72 during today's visit. Continue Entresto 24-26 mg twice daily, Toprol-XL 25 mg daily and Spironolactone 12.5 mg daily.  4. HLD - Will request a copy of most recent labs from his PCP. He was previously intolerant to statin therapy due  to myalgias and his LDL was at 76 in 09/2020 with dietary management. Pending reassessment of labs, could consider the addition of Zetia.  5. Stage 3 CKD - Baseline creatinine 1.4 - 1.5. Stable at 1.56 in 10/2021. Confirmed with the patient he does not take NSAIDS.   6. Dilated Aortic Root - Measured 42 mm by echo in 12/2020. Will reschedule his echocardiogram.    Medication Adjustments/Labs and Tests Ordered: Current medicines are reviewed at length with the patient today.  Concerns regarding medicines are outlined above.  Medication changes, Labs and Tests ordered today are listed in the Patient Instructions below. Patient Instructions  Medication Instructions:  Continue all current medications.  Labwork: none  Testing/Procedures: Your physician has requested that you have an echocardiogram. Echocardiography is a painless test that uses sound waves to create images of your heart. It provides your doctor with information about the size and shape of your heart and how well your heart's chambers and valves are working. This procedure takes approximately one hour. There are no restrictions for this procedure - schedule for November or December.    Follow-Up: 6 months   Any Other Special Instructions Will Be Listed Below (If Applicable).   If you need a refill on your cardiac medications before your next appointment, please call your pharmacy.    Signed, Ellsworth Lennox, PA-C  12/21/2021 4:35 PM     [facility] [address] [place], [place] [address] Phone: [phone] Fax: [phone]

## 2021-12-21 NOTE — Patient Instructions (Addendum)
Medication Instructions:  Continue all current medications.  Labwork: none  Testing/Procedures: Your physician has requested that you have an echocardiogram. Echocardiography is a painless test that uses sound waves to create images of your heart. It provides your doctor with information about the size and shape of your heart and how well your heart's chambers and valves are working. This procedure takes approximately one hour. There are no restrictions for this procedure - schedule for November or December.    Follow-Up: 6 months   Any Other Special Instructions Will Be Listed Below (If Applicable).   If you need a refill on your cardiac medications before your next appointment, please call your pharmacy.

## 2022-01-19 ENCOUNTER — Ambulatory Visit (HOSPITAL_COMMUNITY)
Admission: RE | Admit: 2022-01-19 | Discharge: 2022-01-19 | Disposition: A | Discharge status: Home / Self Care | Payer: BLUE CROSS/BLUE SHIELD | Source: Ambulatory Visit | Attending: Student | Admitting: Student

## 2022-01-19 DIAGNOSIS — I5020 Unspecified systolic (congestive) heart failure: Secondary | ICD-10-CM | POA: Insufficient documentation

## 2022-01-19 LAB — ECHOCARDIOGRAM COMPLETE
AR max vel: 2.8 cm2
AV Area VTI: 2.61 cm2
AV Area mean vel: 2.76 cm2
AV Mean grad: 2.9 mmHg
AV Peak grad: 5.4 mmHg
Ao pk vel: 1.16 m/s
Area-P 1/2: 2.53 cm2
Calc EF: 40.3 %
S' Lateral: 4.1 cm
Single Plane A2C EF: 41.7 %
Single Plane A4C EF: 39 %

## 2022-01-19 NOTE — Progress Notes (Signed)
*  PRELIMINARY RESULTS* Echocardiogram 2D Echocardiogram has been performed.  Manuel Hess 01/19/2022, 2:55 PM

## 2022-02-07 ENCOUNTER — Other Ambulatory Visit: Payer: Self-pay | Admitting: Student

## 2022-02-12 ENCOUNTER — Other Ambulatory Visit: Payer: Self-pay | Admitting: Student

## 2022-05-25 ENCOUNTER — Other Ambulatory Visit: Payer: Self-pay | Admitting: Student

## 2022-06-13 NOTE — Progress Notes (Signed)
Cardiology Office Note  Date: 06/18/2022   ID: Manuel Hess, DOB August 30, 1955, MRN [id number]  History of Present Illness: Manuel Hess is a 67 y.o. male last seen in October 2023 by Ms. Strader PA-C, I reviewed the note.  He is here for a routine visit.  Overall doing well, reports NYHA class I-II dyspnea, no exertional chest pain, no palpitations or syncope.  I reviewed his medications which are stable from a cardiac perspective.  He reports no intolerances.  Weight has been stable, he reports no leg swelling, no orthopnea or PND.  I reviewed his ECG today which shows normal sinus rhythm.  Physical Exam: VS:  BP 126/84   Pulse 84   Ht 5\' 10"  (1.778 m)   Wt 172 lb (78 kg)   SpO2 98%   BMI 24.68 kg/m , BMI Body mass index is 24.68 kg/m.  Wt Readings from Last 3 Encounters:  06/18/22 172 lb (78 kg)  12/21/21 170 lb (77.1 kg)  09/06/21 171 lb (77.6 kg)    General: Patient appears comfortable at rest. HEENT: Conjunctiva and lids normal. Neck: Supple, no elevated JVP or carotid bruits. Lungs: Clear to auscultation, nonlabored breathing at rest. Cardiac: Regular rate and rhythm, no S3 or significant systolic murmur. Extremities: No pitting edema.  ECG:  An ECG dated 08/03/2021 was personally reviewed today and demonstrated:  Sinus rhythm.  Labwork: 08/03/2021: ALT 11; AST 14; Hemoglobin 13.6; Platelets 187 10/30/2021: BUN 17; Creatinine, Ser 1.56; Potassium 4.3; Sodium 141     Component Value Date/Time   CHOL 134 10/11/2020 1538   CHOL 143 07/06/2013 0848   CHOL 148 06/01/2013 0319   TRIG 112 10/11/2020 1538   TRIG 76 06/01/2013 0319   HDL 36 (L) 10/11/2020 1538   HDL 38 (L) 07/06/2013 0848   HDL 37 (L) 06/01/2013 0319   CHOLHDL 3.7 10/11/2020 1538   VLDL 22 10/11/2020 1538   VLDL 15 06/01/2013 0319   LDLCALC 76 10/11/2020 1538   LDLCALC 85 07/06/2013 0848   LDLCALC 96 06/01/2013 0319   Other Studies Reviewed Today:  Echocardiogram 01/19/2022:  1. Left ventricular  ejection fraction, by estimation, is 40 to 45%. The  left ventricle has mildly decreased function. The left ventricle  demonstrates global hypokinesis. Left ventricular diastolic parameters are  indeterminate.   2. Right ventricular systolic function is normal. The right ventricular  size is mildly enlarged. Tricuspid regurgitation signal is inadequate for  assessing PA pressure.   3. The mitral valve is normal in structure. No evidence of mitral valve  regurgitation. No evidence of mitral stenosis.   4. The tricuspid valve is abnormal.   5. The aortic valve is tricuspid. There is mild calcification of the  aortic valve. There is mild thickening of the aortic valve. Aortic valve  regurgitation is not visualized. No aortic stenosis is present.   6. Aortic dilatation noted. There is mild dilatation of the aortic root,  measuring 43 mm.   7. The inferior vena cava is normal in size with greater than 50%  respiratory variability, suggesting right atrial pressure of 3 mmHg.   Assessment and Plan:  1.  CAD with occluded third diagonal and 50% circumflex stenosis at cardiac catheterization in 2015, managed medically.  He reports no active angina and continues on aspirin.  2.  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  Clinically  stable with NYHA class I-II dyspnea.  Continue Toprol-XL, Entresto, Farxiga, and Aldactone.  Follow-up echocardiogram for next visit in 6 months.  3.  Essential hypertension.  Blood pressure adequately controlled today.  4.  Mixed hyperlipidemia.  LDL 76 in July 2022.  Anticipate follow-up lab work with PCP this year.  Can discuss at least low-dose statin therapy thereafter.  5.  CKD stage IIIb.  Most recent creatinine 1.56.  6.  Asymptomatic, mild aortic root dilatation, 43 mm by echocardiogram in November 2023.  Disposition:  Follow up  6  months.  Signed, Satira Sark, M.D., F.A.C.C. [place] at [facility]

## 2022-06-18 ENCOUNTER — Encounter: Payer: Self-pay | Admitting: Cardiology

## 2022-06-18 ENCOUNTER — Ambulatory Visit: Payer: BLUE CROSS/BLUE SHIELD | Attending: Cardiology | Admitting: Cardiology

## 2022-06-18 VITALS — BP 126/84 | HR 84 | Ht 70.0 in | Wt 172.0 lb

## 2022-06-18 DIAGNOSIS — I5022 Chronic systolic (congestive) heart failure: Secondary | ICD-10-CM

## 2022-06-18 DIAGNOSIS — I10 Essential (primary) hypertension: Secondary | ICD-10-CM

## 2022-06-18 DIAGNOSIS — I2510 Atherosclerotic heart disease of native coronary artery without angina pectoris: Secondary | ICD-10-CM

## 2022-06-18 NOTE — Patient Instructions (Signed)
Medication Instructions:  Your physician recommends that you continue on your current medications as directed. Please refer to the Current Medication list given to you today.  *If you need a refill on your cardiac medications before your next appointment, please call your pharmacy*   Lab Work: None If you have labs (blood work) drawn today and your tests are completely normal, you will receive your results only by: [place] (if you have MyChart) OR A paper copy in the mail If you have any lab test that is abnormal or we need to change your treatment, we will call you to review the results.   Testing/Procedures: Your physician has requested that you have an echocardiogram. Echocardiography is a painless test that uses sound waves to create images of your heart. It provides your doctor with information about the size and shape of your heart and how well your heart's chambers and valves are working. This procedure takes approximately one hour. There are no restrictions for this procedure. Please do NOT wear cologne, perfume, aftershave, or lotions (deodorant is allowed). Please arrive 15 minutes prior to your appointment time.    Follow-Up: At [facility], you and your health needs are our priority.  As part of our continuing mission to provide you with exceptional heart care, we have created designated Provider Care Teams.  These Care Teams include your primary Cardiologist (physician) and Advanced Practice Providers (APPs -  Physician Assistants and Nurse Practitioners) who all work together to provide you with the care you need, when you need it.  We recommend signing up for the patient portal called "MyChart".  Sign up information is provided on this After Visit Summary.  MyChart is used to connect with patients for Virtual Visits (Telemedicine).  Patients are able to view lab/test results, encounter notes, upcoming appointments, etc.  Non-urgent messages can be sent to your  provider as well.   To learn more about what you can do with MyChart, go to [email or web address].    Your next appointment:   6 month(s)  Provider:   Rozann Lesches, MD    Other Instructions Thank you for choosing [facility] !

## 2022-08-20 ENCOUNTER — Other Ambulatory Visit: Payer: Self-pay | Admitting: Student

## 2022-12-10 ENCOUNTER — Ambulatory Visit (HOSPITAL_COMMUNITY): Admission: RE | Admit: 2022-12-10 | Payer: BLUE CROSS/BLUE SHIELD | Source: Ambulatory Visit

## 2022-12-26 ENCOUNTER — Other Ambulatory Visit: Payer: Self-pay

## 2022-12-26 MED ORDER — METOPROLOL SUCCINATE ER 25 MG PO TB24: 25.0000 mg | ORAL_TABLET | Freq: Every day | ORAL | 2 refills | Status: DC

## 2022-12-26 NOTE — Telephone Encounter (Signed)
Refilled toprol xl 25 mg every day,#90,RF:2 to [facility]

## 2023-02-01 ENCOUNTER — Ambulatory Visit (HOSPITAL_COMMUNITY)
Admission: RE | Admit: 2023-02-01 | Discharge: 2023-02-01 | Disposition: A | Discharge status: Home / Self Care | Payer: Self-pay | Source: Ambulatory Visit | Attending: Cardiology | Admitting: Cardiology

## 2023-02-01 DIAGNOSIS — I5022 Chronic systolic (congestive) heart failure: Secondary | ICD-10-CM

## 2023-02-01 NOTE — Progress Notes (Signed)
Patient was a no show for echocardiogram. Attempted to call patient x 2. Attempted to call his alternate contact x 1. Unable to speak with anyone.                 Celesta Gentile, RCS

## 2023-03-25 ENCOUNTER — Telehealth: Payer: Self-pay | Admitting: Cardiology

## 2023-03-25 MED ORDER — ENTRESTO 24-26 MG PO TABS: 1.0000 | ORAL_TABLET | Freq: Two times a day (BID) | ORAL | 0 refills | Status: DC

## 2023-03-25 NOTE — Telephone Encounter (Signed)
*  STAT* If patient is at the pharmacy, call can be transferred to refill team.   1. Which medications need to be refilled? (please list name of each medication and dose if known)   sacubitril -valsartan  (ENTRESTO ) 24-26 MG    2. Which pharmacy/location (including street and city if local pharmacy) is medication to be sent to?  [facility] - [place], [place] - [address]    3. Do they need a 30 day or 90 day supply? 90

## 2023-03-25 NOTE — Telephone Encounter (Signed)
 Completed. Pt needs f/u appt for further refills.

## 2023-03-27 ENCOUNTER — Ambulatory Visit (HOSPITAL_COMMUNITY)
Admission: RE | Admit: 2023-03-27 | Discharge: 2023-03-27 | Disposition: A | Discharge status: Home / Self Care | Payer: Medicare Other | Source: Ambulatory Visit | Attending: Cardiology | Admitting: Cardiology

## 2023-03-27 DIAGNOSIS — I5022 Chronic systolic (congestive) heart failure: Secondary | ICD-10-CM | POA: Diagnosis present

## 2023-03-27 LAB — ECHOCARDIOGRAM COMPLETE
AR max vel: 3.95 cm2
AV Area VTI: 3.77 cm2
AV Area mean vel: 3.59 cm2
AV Mean grad: 2 mm[Hg]
AV Peak grad: 3.7 mm[Hg]
Ao pk vel: 0.96 m/s
Area-P 1/2: 2.26 cm2
Calc EF: 51.4 %
S' Lateral: 3.5 cm
Single Plane A2C EF: 48.5 %
Single Plane A4C EF: 57.9 %

## 2023-03-27 NOTE — Progress Notes (Signed)
*  PRELIMINARY RESULTS* Echocardiogram 2D Echocardiogram has been performed.  Stacey Drain 03/27/2023, 10:26 AM

## 2023-05-03 ENCOUNTER — Other Ambulatory Visit: Payer: Self-pay

## 2023-05-03 MED ORDER — SACUBITRIL-VALSARTAN 24-26 MG PO TABS: 1.0000 | ORAL_TABLET | Freq: Two times a day (BID) | ORAL | 0 refills | Status: DC

## 2023-06-25 ENCOUNTER — Other Ambulatory Visit: Payer: Self-pay

## 2023-06-25 ENCOUNTER — Telehealth: Payer: Self-pay | Admitting: Student

## 2023-06-25 MED ORDER — ENTRESTO 24-26 MG PO TABS: 1.0000 | ORAL_TABLET | Freq: Two times a day (BID) | ORAL | 0 refills | Status: DC

## 2023-06-25 NOTE — Telephone Encounter (Signed)
 Completed.

## 2023-06-25 NOTE — Telephone Encounter (Signed)
*  STAT* If patient is at the pharmacy, call can be transferred to refill team.   1. Which medications need to be refilled? (please list name of each medication and dose if known) New Prescription for Entrest 30 days and #60   2. Would you like to learn more about the convenience, safety, & potential cost savings by using the [facility]?      3. Are you open to using the [facility] (Type [facility].    4. Which pharmacy/location (including street and city if local pharmacy) is medication to be sent to?[facility]   5. Do they need a 30 day or 90 day supply? 30 days # 60- they do not break the package for 15 tablets

## 2023-08-16 ENCOUNTER — Other Ambulatory Visit: Payer: Self-pay | Admitting: Cardiology

## 2023-09-19 ENCOUNTER — Other Ambulatory Visit: Payer: Self-pay | Admitting: Cardiology

## 2023-10-01 ENCOUNTER — Other Ambulatory Visit: Payer: Self-pay

## 2023-10-01 MED ORDER — SACUBITRIL-VALSARTAN 24-26 MG PO TABS: 1.0000 | ORAL_TABLET | Freq: Two times a day (BID) | ORAL | 0 refills | Status: DC

## 2023-10-10 ENCOUNTER — Other Ambulatory Visit: Payer: Self-pay | Admitting: Cardiology

## 2023-10-10 MED ORDER — METOPROLOL SUCCINATE ER 25 MG PO TB24: 25.0000 mg | ORAL_TABLET | Freq: Every day | ORAL | 0 refills | Status: AC

## 2023-10-10 MED ORDER — DAPAGLIFLOZIN PROPANEDIOL 10 MG PO TABS: 10.0000 mg | ORAL_TABLET | Freq: Every day | ORAL | 0 refills | Status: AC

## 2023-11-15 ENCOUNTER — Other Ambulatory Visit: Payer: Self-pay | Admitting: Cardiology

## 2023-11-19 ENCOUNTER — Telehealth: Payer: Self-pay | Admitting: Cardiology

## 2023-11-19 NOTE — Telephone Encounter (Signed)
 Pt c/o medication issue:  1. Name of Medication:   sacubitril -valsartan  (ENTRESTO ) 24-26 MG   2. How are you currently taking this medication (dosage and times per day)?   3. Are you having a reaction (difficulty breathing--STAT)?   4. What is your medication issue?   Caller [facility]) stated this medication comes in an unbreakable bottle and she want approval to dispense 60 tablets as current prescription is only for 15.

## 2023-11-19 NOTE — Telephone Encounter (Signed)
 I spoke with Hoy at [facility] and told her patient has not been seen in 1 1/2 years and needs appointment.  I have asked front desk to schedule an appointment.

## 2023-11-28 ENCOUNTER — Other Ambulatory Visit: Payer: Self-pay | Admitting: Cardiology

## 2023-12-18 ENCOUNTER — Ambulatory Visit: Attending: Cardiology | Admitting: Cardiology

## 2023-12-18 ENCOUNTER — Encounter: Payer: Self-pay | Admitting: Cardiology

## 2023-12-18 VITALS — BP 116/82 | HR 61 | Ht 68.0 in | Wt 150.8 lb

## 2023-12-18 DIAGNOSIS — E782 Mixed hyperlipidemia: Secondary | ICD-10-CM

## 2023-12-18 DIAGNOSIS — I2510 Atherosclerotic heart disease of native coronary artery without angina pectoris: Secondary | ICD-10-CM

## 2023-12-18 DIAGNOSIS — N1832 Chronic kidney disease, stage 3b: Secondary | ICD-10-CM | POA: Diagnosis not present

## 2023-12-18 DIAGNOSIS — I5020 Unspecified systolic (congestive) heart failure: Secondary | ICD-10-CM | POA: Diagnosis not present

## 2023-12-18 DIAGNOSIS — I25119 Atherosclerotic heart disease of native coronary artery with unspecified angina pectoris: Secondary | ICD-10-CM | POA: Diagnosis not present

## 2023-12-18 MED ORDER — SACUBITRIL-VALSARTAN 24-26 MG PO TABS: 1.0000 | ORAL_TABLET | Freq: Two times a day (BID) | ORAL | 11 refills | Status: AC

## 2023-12-18 NOTE — Progress Notes (Signed)
 Cardiology Office Note  Date: 12/18/2023   ID: Manuel Hess, DOB December 15, 1955, MRN [id number]  History of Present Illness: Manuel Hess is a 67 y.o. male last seen in April 2024.  He is here for a follow-up visit.  Reports NYHA class I dyspnea, no fluid retention, no exertional chest pain.  He has lost about 20 pounds since last year.  Still working in [employer].  We went over his medications.  He has run out of most of them, taking Entresto  24/26 mg twice daily now and aspirin  81 mg daily.  He has not had any recent lab work.  I did review his echocardiogram done earlier this year showing stable LVEF in the range of 40 to 45% with mild RV dysfunction.  I reviewed his ECG today which shows sinus rhythm with PAC.  Physical Exam: VS:  BP 116/82   Pulse 61   Ht 5' 8 (1.727 m)   Wt 150 lb 12.8 oz (68.4 kg)   SpO2 97%   BMI 22.93 kg/m , BMI Body mass index is 22.93 kg/m.  Wt Readings from Last 3 Encounters:  12/18/23 150 lb 12.8 oz (68.4 kg)  06/18/22 172 lb (78 kg)  12/21/21 170 lb (77.1 kg)    General: Patient appears comfortable at rest. HEENT: Conjunctiva and lids normal. Neck: Supple, no elevated JVP or carotid bruits. Lungs: Clear to auscultation, nonlabored breathing at rest. Cardiac: Regular rate and rhythm, no S3 or significant systolic murmur. Extremities: No pitting edema.  ECG:  An ECG dated 06/18/2022 was personally reviewed today and demonstrated:  Sinus rhythm.  Labwork:  No recent lab work available for review.  Other Studies Reviewed Today:  Echocardiogram 03/27/2023:  1. Left ventricular ejection fraction, by estimation, is 40 to 45%. The  left ventricle has mildly decreased function. The left ventricle  demonstrates global hypokinesis. Left ventricular diastolic parameters are  indeterminate.   2. Right ventricular systolic function is mildly reduced. The right  ventricular size is moderately enlarged. There is normal pulmonary artery  systolic pressure.    3. The mitral valve is normal in structure. Trivial mitral valve  regurgitation. No evidence of mitral stenosis.   4. The tricuspid valve is abnormal.   5. The aortic valve is tricuspid. Aortic valve regurgitation is not  visualized. No aortic stenosis is present.   6. Aortic dilatation noted. There is mild dilatation of the aortic root,  measuring 41 mm.   7. The inferior vena cava is normal in size with greater than 50%  respiratory variability, suggesting right atrial pressure of 3 mmHg.   Assessment and Plan:  1.  CAD with occluded third diagonal and 50% circumflex stenosis at cardiac catheterization in 2015, managed medically.  He does not report any angina.  ECG reviewed.  Continue aspirin  81 mg daily.   2.  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  Also mild RV dysfunction.  Reports NYHA class I dyspnea, no fluid retention.  Has been off most of his medications as discussed above.  For now continue Entresto  24/26 mg twice daily and obtain BMET.  His blood pressure and heart rate look good off therapy, can adjust GDMT as able.  3.  Mixed hyperlipidemia.  Check FLP.   4.  CKD stage IIIb.  Plan BMET.   5.  Asymptomatic, mild aortic root dilatation, 41 mm by echocardiogram in January.  Disposition:  Follow up 6 months.  Signed, Jayson JUDITHANN Sierras, M.D., F.A.C.C.  [facility] at [facility]

## 2023-12-18 NOTE — Patient Instructions (Addendum)
 Medication Instructions:   Take Entresto  24/26 mg twice a day  Take Aspirin  81 mg a day   We will call you after your blood work to see what additional medication you need to take.   Labwork: Fasting lipids, BMET  Testing/Procedures: None today  Follow-Up: 6 months  Any Other Special Instructions Will Be Listed Below (If Applicable).  If you need a refill on your cardiac medications before your next appointment, please call your pharmacy.

## 2023-12-23 ENCOUNTER — Ambulatory Visit: Payer: Self-pay | Admitting: Cardiology

## 2023-12-23 ENCOUNTER — Other Ambulatory Visit (HOSPITAL_COMMUNITY)
Admission: RE | Admit: 2023-12-23 | Discharge: 2023-12-23 | Disposition: A | Discharge status: Home / Self Care | Source: Ambulatory Visit | Attending: Cardiology | Admitting: Cardiology

## 2023-12-23 DIAGNOSIS — I5020 Unspecified systolic (congestive) heart failure: Secondary | ICD-10-CM | POA: Diagnosis present

## 2023-12-23 DIAGNOSIS — I2510 Atherosclerotic heart disease of native coronary artery without angina pectoris: Secondary | ICD-10-CM | POA: Insufficient documentation

## 2023-12-23 DIAGNOSIS — I25119 Atherosclerotic heart disease of native coronary artery with unspecified angina pectoris: Secondary | ICD-10-CM | POA: Diagnosis present

## 2023-12-23 DIAGNOSIS — N1832 Chronic kidney disease, stage 3b: Secondary | ICD-10-CM | POA: Diagnosis present

## 2023-12-23 DIAGNOSIS — E782 Mixed hyperlipidemia: Secondary | ICD-10-CM | POA: Insufficient documentation

## 2023-12-23 LAB — LIPID PANEL
Cholesterol: 182 mg/dL (ref 0–200)
HDL: 44 mg/dL (ref >40)
LDL Cholesterol: 122 mg/dL — ABNORMAL HIGH (ref 0–99)
Total CHOL/HDL Ratio: 4.1 ratio
Triglycerides: 80 mg/dL (ref <150)
VLDL: 16 mg/dL (ref 0–40)

## 2023-12-23 LAB — BASIC METABOLIC PANEL WITH GFR
Anion gap: 11 (ref 5–15)
BUN: 18 mg/dL (ref 8–23)
CO2: 24 mmol/L (ref 22–32)
Calcium: 9.3 mg/dL (ref 8.9–10.3)
Chloride: 105 mmol/L (ref 98–111)
Creatinine, Ser: 1.37 mg/dL — ABNORMAL HIGH (ref 0.61–1.24)
GFR, Estimated: 57 mL/min — ABNORMAL LOW (ref >60)
Glucose, Bld: 89 mg/dL (ref 70–99)
Potassium: 4.1 mmol/L (ref 3.5–5.1)
Sodium: 141 mmol/L (ref 135–145)

## 2023-12-23 MED ORDER — ROSUVASTATIN CALCIUM 5 MG PO TABS: 5.0000 mg | ORAL_TABLET | Freq: Every day | ORAL | 3 refills | Status: AC

## 2023-12-23 NOTE — Telephone Encounter (Signed)
-----   Message from Jayson Sierras sent at 12/23/2023  9:30 AM EDT ----- Results reviewed.  Creatinine 1.37 with GFR 57, somewhat better compared to last available lab work.  LDL is up to 122 however.  Would suggest starting Crestor 5 mg daily. ----- Message ----- From: Interface, Lab In [place] Sent: 12/23/2023   9:03 AM EDT To: Jayson KANDICE Sierras, MD

## 2023-12-23 NOTE — Telephone Encounter (Signed)
 The patient has been notified of the result and verbalized understanding.  All questions (if any) were answered. Bernett Dorothyann LABOR, RN 12/23/2023 4:19 PM    Crestor 5 mg daily sent to [facility]
